# Patient Record
Sex: Male | Born: 1940 | Race: White | Hispanic: No | Smoking: Former smoker
Health system: Southern US, Community
[De-identification: ages and names within clinical notes are randomized; demographics above are authoritative.]

## PROBLEM LIST (undated history)

## (undated) DIAGNOSIS — L309 Dermatitis, unspecified: Secondary | ICD-10-CM

## (undated) DIAGNOSIS — S72002A Fracture of unspecified part of neck of left femur, initial encounter for closed fracture: Secondary | ICD-10-CM

## (undated) DIAGNOSIS — G4733 Obstructive sleep apnea (adult) (pediatric): Secondary | ICD-10-CM

## (undated) DIAGNOSIS — K5792 Diverticulitis of intestine, part unspecified, without perforation or abscess without bleeding: Secondary | ICD-10-CM

## (undated) DIAGNOSIS — E785 Hyperlipidemia, unspecified: Secondary | ICD-10-CM

## (undated) DIAGNOSIS — N529 Male erectile dysfunction, unspecified: Secondary | ICD-10-CM

## (undated) DIAGNOSIS — M810 Age-related osteoporosis without current pathological fracture: Secondary | ICD-10-CM

## (undated) DIAGNOSIS — I10 Essential (primary) hypertension: Secondary | ICD-10-CM

## (undated) HISTORY — DX: Diverticulitis of intestine, part unspecified, without perforation or abscess without bleeding: K57.92

## (undated) HISTORY — PX: TONSILLECTOMY: SUR1361

## (undated) HISTORY — DX: Obstructive sleep apnea (adult) (pediatric): G47.33

## (undated) HISTORY — DX: Fracture of unspecified part of neck of left femur, initial encounter for closed fracture: S72.002A

## (undated) HISTORY — DX: Essential (primary) hypertension: I10

## (undated) HISTORY — DX: Hyperlipidemia, unspecified: E78.5

## (undated) HISTORY — DX: Male erectile dysfunction, unspecified: N52.9

## (undated) HISTORY — DX: Dermatitis, unspecified: L30.9

---

## 2003-06-04 ENCOUNTER — Emergency Department (HOSPITAL_COMMUNITY): Admission: EM | Admit: 2003-06-04 | Discharge: 2003-06-05 | Payer: Self-pay | Admitting: Emergency Medicine

## 2003-11-06 HISTORY — PX: COLONOSCOPY: SHX174

## 2004-03-02 ENCOUNTER — Ambulatory Visit (HOSPITAL_COMMUNITY): Admission: RE | Admit: 2004-03-02 | Discharge: 2004-03-02 | Payer: Self-pay | Admitting: Gastroenterology

## 2008-06-08 ENCOUNTER — Encounter: Admission: RE | Admit: 2008-06-08 | Discharge: 2008-06-08 | Payer: Self-pay | Admitting: Sports Medicine

## 2008-06-08 ENCOUNTER — Ambulatory Visit: Payer: Self-pay | Admitting: Sports Medicine

## 2008-06-08 DIAGNOSIS — R269 Unspecified abnormalities of gait and mobility: Secondary | ICD-10-CM

## 2008-06-08 DIAGNOSIS — M25559 Pain in unspecified hip: Secondary | ICD-10-CM | POA: Insufficient documentation

## 2008-07-14 ENCOUNTER — Ambulatory Visit: Payer: Self-pay | Admitting: Sports Medicine

## 2009-02-17 ENCOUNTER — Emergency Department (HOSPITAL_COMMUNITY): Admission: EM | Admit: 2009-02-17 | Discharge: 2009-02-17 | Payer: Self-pay | Admitting: Emergency Medicine

## 2011-03-23 NOTE — Op Note (Signed)
NAMEZACHARIAS, [name] NO.:  [id number]   MEDICAL RECORD NO.:  [id number]                   PATIENT TYPE:  AMB   LOCATION:  ENDO                                 FACILITY:  [facility]   PHYSICIAN:  Danise Edge, M.D.                DATE OF BIRTH:  1940/12/25   DATE OF PROCEDURE:  03/02/2004  DATE OF DISCHARGE:                                 OPERATIVE REPORT   REFERRING PHYSICIAN:  Dr. Foye Deer.   PROCEDURE:  Colonoscopy.   PROCEDURE INDICATION:  Mr. [name] is a 70 year old male born [birth year].  Mr. [name] is undergoing a screening colonoscopy with polypectomy  to prevent colon cancer.   ENDOSCOPIST:  Danise Edge, M.D.   PREMEDICATION:  Versed 7.5 mg, Demerol 50 mg.   DESCRIPTION OF PROCEDURE:  After obtaining informed consent, Mr. [name] was  placed in the left lateral decubitus position.  I administered intravenous  Demerol and intravenous Versed to achieve conscious sedation for the  procedure.  The patient's blood pressure, oxygen saturation, and cardiac  rhythm were monitored throughout the procedure and documented in the medical  record.   Anal inspection and digital rectal exam were normal.  The Olympus adjustable  pediatric video colonoscope was introduced into the rectum and advanced to  the cecum.  Colonic preparation for the exam today was excellent.   Rectum:  Normal.   Sigmoid colon and descending colon:  Left colonic diverticulosis.   Splenic flexure:  Normal.   Transverse colon:  Normal.   Hepatic flexure:  Normal.   Ascending colon:  Normal.   Cecum and ileocecal valve:  Normal.   ASSESSMENT:  Normal screening proctocolonoscopy to the cecum.  Left colonic  diverticulosis without diverticulitis or diverticular stricture formation.                                               Danise Edge, M.D.    MJ/MEDQ  D:  03/02/2004  T:  03/02/2004  Job:  [id number]   cc:   Dellis Anes. Idell Pickles, M.D.  [address]  [place]  [place] [address]  Fax: [phone]

## 2011-12-11 DIAGNOSIS — G4733 Obstructive sleep apnea (adult) (pediatric): Secondary | ICD-10-CM | POA: Diagnosis not present

## 2011-12-11 DIAGNOSIS — I10 Essential (primary) hypertension: Secondary | ICD-10-CM | POA: Diagnosis not present

## 2012-04-11 DIAGNOSIS — L259 Unspecified contact dermatitis, unspecified cause: Secondary | ICD-10-CM | POA: Diagnosis not present

## 2012-06-04 DIAGNOSIS — L570 Actinic keratosis: Secondary | ICD-10-CM | POA: Diagnosis not present

## 2012-06-04 DIAGNOSIS — L821 Other seborrheic keratosis: Secondary | ICD-10-CM | POA: Diagnosis not present

## 2012-06-04 DIAGNOSIS — C44519 Basal cell carcinoma of skin of other part of trunk: Secondary | ICD-10-CM | POA: Diagnosis not present

## 2012-06-04 DIAGNOSIS — D485 Neoplasm of uncertain behavior of skin: Secondary | ICD-10-CM | POA: Diagnosis not present

## 2012-06-12 DIAGNOSIS — Z0000 Encounter for general adult medical examination without abnormal findings: Secondary | ICD-10-CM | POA: Diagnosis not present

## 2012-06-12 DIAGNOSIS — E785 Hyperlipidemia, unspecified: Secondary | ICD-10-CM | POA: Diagnosis not present

## 2012-06-12 DIAGNOSIS — Z23 Encounter for immunization: Secondary | ICD-10-CM | POA: Diagnosis not present

## 2012-06-12 DIAGNOSIS — I10 Essential (primary) hypertension: Secondary | ICD-10-CM | POA: Diagnosis not present

## 2012-06-12 DIAGNOSIS — Z125 Encounter for screening for malignant neoplasm of prostate: Secondary | ICD-10-CM | POA: Diagnosis not present

## 2012-06-12 DIAGNOSIS — L2089 Other atopic dermatitis: Secondary | ICD-10-CM | POA: Diagnosis not present

## 2012-06-12 DIAGNOSIS — N529 Male erectile dysfunction, unspecified: Secondary | ICD-10-CM | POA: Diagnosis not present

## 2012-06-12 DIAGNOSIS — G4733 Obstructive sleep apnea (adult) (pediatric): Secondary | ICD-10-CM | POA: Diagnosis not present

## 2012-07-03 DIAGNOSIS — G4733 Obstructive sleep apnea (adult) (pediatric): Secondary | ICD-10-CM | POA: Diagnosis not present

## 2012-07-03 DIAGNOSIS — I10 Essential (primary) hypertension: Secondary | ICD-10-CM | POA: Diagnosis not present

## 2012-07-03 DIAGNOSIS — G479 Sleep disorder, unspecified: Secondary | ICD-10-CM | POA: Diagnosis not present

## 2013-01-28 DIAGNOSIS — G4733 Obstructive sleep apnea (adult) (pediatric): Secondary | ICD-10-CM | POA: Diagnosis not present

## 2013-01-28 DIAGNOSIS — I10 Essential (primary) hypertension: Secondary | ICD-10-CM | POA: Diagnosis not present

## 2013-02-04 DIAGNOSIS — H524 Presbyopia: Secondary | ICD-10-CM | POA: Diagnosis not present

## 2013-02-04 DIAGNOSIS — H2510 Age-related nuclear cataract, unspecified eye: Secondary | ICD-10-CM | POA: Diagnosis not present

## 2013-03-11 DIAGNOSIS — D239 Other benign neoplasm of skin, unspecified: Secondary | ICD-10-CM | POA: Diagnosis not present

## 2013-03-11 DIAGNOSIS — L219 Seborrheic dermatitis, unspecified: Secondary | ICD-10-CM | POA: Diagnosis not present

## 2013-03-11 DIAGNOSIS — Z85828 Personal history of other malignant neoplasm of skin: Secondary | ICD-10-CM | POA: Diagnosis not present

## 2013-03-11 DIAGNOSIS — L570 Actinic keratosis: Secondary | ICD-10-CM | POA: Diagnosis not present

## 2013-03-11 DIAGNOSIS — L408 Other psoriasis: Secondary | ICD-10-CM | POA: Diagnosis not present

## 2013-03-11 DIAGNOSIS — L719 Rosacea, unspecified: Secondary | ICD-10-CM | POA: Diagnosis not present

## 2013-04-11 DIAGNOSIS — S61409A Unspecified open wound of unspecified hand, initial encounter: Secondary | ICD-10-CM | POA: Diagnosis not present

## 2013-06-30 DIAGNOSIS — Z125 Encounter for screening for malignant neoplasm of prostate: Secondary | ICD-10-CM | POA: Diagnosis not present

## 2013-06-30 DIAGNOSIS — E785 Hyperlipidemia, unspecified: Secondary | ICD-10-CM | POA: Diagnosis not present

## 2013-06-30 DIAGNOSIS — I10 Essential (primary) hypertension: Secondary | ICD-10-CM | POA: Diagnosis not present

## 2013-07-01 DIAGNOSIS — Z1331 Encounter for screening for depression: Secondary | ICD-10-CM | POA: Diagnosis not present

## 2013-07-01 DIAGNOSIS — E785 Hyperlipidemia, unspecified: Secondary | ICD-10-CM | POA: Diagnosis not present

## 2013-07-01 DIAGNOSIS — N529 Male erectile dysfunction, unspecified: Secondary | ICD-10-CM | POA: Diagnosis not present

## 2013-07-01 DIAGNOSIS — Z0000 Encounter for general adult medical examination without abnormal findings: Secondary | ICD-10-CM | POA: Diagnosis not present

## 2013-07-01 DIAGNOSIS — I10 Essential (primary) hypertension: Secondary | ICD-10-CM | POA: Diagnosis not present

## 2013-07-01 DIAGNOSIS — G4733 Obstructive sleep apnea (adult) (pediatric): Secondary | ICD-10-CM | POA: Diagnosis not present

## 2013-07-01 DIAGNOSIS — Z23 Encounter for immunization: Secondary | ICD-10-CM | POA: Diagnosis not present

## 2013-07-08 ENCOUNTER — Other Ambulatory Visit: Payer: Self-pay | Admitting: Family Medicine

## 2013-07-08 DIAGNOSIS — Z136 Encounter for screening for cardiovascular disorders: Secondary | ICD-10-CM

## 2013-07-15 ENCOUNTER — Ambulatory Visit
Admission: RE | Admit: 2013-07-15 | Discharge: 2013-07-15 | Disposition: A | Discharge status: Home / Self Care | Payer: Medicare Other | Source: Ambulatory Visit | Attending: Family Medicine | Admitting: Family Medicine

## 2013-07-15 DIAGNOSIS — Z136 Encounter for screening for cardiovascular disorders: Secondary | ICD-10-CM

## 2013-07-21 DIAGNOSIS — G4733 Obstructive sleep apnea (adult) (pediatric): Secondary | ICD-10-CM | POA: Diagnosis not present

## 2013-07-21 DIAGNOSIS — I10 Essential (primary) hypertension: Secondary | ICD-10-CM | POA: Diagnosis not present

## 2013-08-03 DIAGNOSIS — S32009A Unspecified fracture of unspecified lumbar vertebra, initial encounter for closed fracture: Secondary | ICD-10-CM | POA: Diagnosis not present

## 2013-08-03 DIAGNOSIS — Z13820 Encounter for screening for osteoporosis: Secondary | ICD-10-CM | POA: Diagnosis not present

## 2013-08-20 DIAGNOSIS — M810 Age-related osteoporosis without current pathological fracture: Secondary | ICD-10-CM | POA: Diagnosis not present

## 2013-12-01 ENCOUNTER — Telehealth: Payer: Self-pay | Admitting: Cardiology

## 2013-12-01 NOTE — Telephone Encounter (Signed)
Have not received download.  

## 2013-12-01 NOTE — Telephone Encounter (Signed)
Called stating he took his Cpap machine to [facility] for them to download information.  He is calling wanting to know if Dr. Radford Pax received the information and " what they did "was what Dr. Radford Pax wanted.  Spoke w/Betsy  Sweetser  w/[facility] and she will fax information to Dr. Radford Pax.  Pt states he is not having any problems.  Has an appointment in March w/Dr. Radford Pax. Will forward to Dr. Radford Pax for her to evaluate the information.

## 2013-12-01 NOTE — Telephone Encounter (Signed)
New message  Pt called states that he is seen for Cpac // he says that there should have been updates with his settings and he is not sure that this has ever been done// please call to discuss.

## 2013-12-02 NOTE — Telephone Encounter (Signed)
Waiting on Download.

## 2013-12-09 ENCOUNTER — Encounter: Payer: Self-pay | Admitting: Cardiology

## 2013-12-09 NOTE — Telephone Encounter (Signed)
Gwinda Passe is looking into this because she stated she did not have any notes on file for this pt coming in when he said he did.

## 2013-12-09 NOTE — Telephone Encounter (Signed)
Messaged Betsy at [facility] to see why we have no yet received fax.

## 2013-12-14 NOTE — Telephone Encounter (Signed)
[facility] is going to [facility] to [facility] to have card Downloaded. Will let pt know once we received.

## 2014-01-13 ENCOUNTER — Ambulatory Visit: Payer: Medicare Other | Admitting: Cardiology

## 2014-01-14 ENCOUNTER — Encounter: Payer: Self-pay | Admitting: General Surgery

## 2014-01-14 DIAGNOSIS — I10 Essential (primary) hypertension: Secondary | ICD-10-CM

## 2014-01-14 DIAGNOSIS — G4733 Obstructive sleep apnea (adult) (pediatric): Secondary | ICD-10-CM | POA: Insufficient documentation

## 2014-01-19 ENCOUNTER — Ambulatory Visit (INDEPENDENT_AMBULATORY_CARE_PROVIDER_SITE_OTHER): Payer: Medicare Other | Admitting: Cardiology

## 2014-01-19 ENCOUNTER — Encounter: Payer: Self-pay | Admitting: Cardiology

## 2014-01-19 VITALS — BP 132/77 | HR 79 | Ht 71.0 in | Wt 175.0 lb

## 2014-01-19 DIAGNOSIS — G4733 Obstructive sleep apnea (adult) (pediatric): Secondary | ICD-10-CM | POA: Diagnosis not present

## 2014-01-19 DIAGNOSIS — I10 Essential (primary) hypertension: Secondary | ICD-10-CM

## 2014-01-19 NOTE — Progress Notes (Signed)
[place], [place], [place]  [address] Phone: [phone] Fax:  [phone]  Date:  01/19/2014   ID:  Antonio [name], DOB 10-23-1941, MRN [id number]  PCP:  Antonio Pac, MD  Sleep Medicine:  Antonio Him, MD   History of Present Illness: Antonio [name] is a 73 y.o. male with a history of OSA and HTN who presents today for followup.  He is doing well.  He tolerates his CPAP device without problems.  He feels better rested when he gets up in the am and has no daytime sleepiness than he had in the past.  He uses a nasal mask with no chin strap.  He feels the pressure is adequate.     Wt Readings from Last 3 Encounters:  01/19/14 175 lb (79.379 kg)  07/14/08 184 lb (83.462 kg)  06/08/08 188 lb (85.276 kg)     Past Medical History  Diagnosis Date  . Dyslipidemia   . OSA (obstructive sleep apnea)     primarily upper airway resistance syndrome, on CPAP at 11 cm H2O (PSG AHI 3.37/hr and RDI 46.5/hr) followed by Dr Antonio [name]  . Hypertension   . Eczema     BCC and AK followed by dermatologist Dr Antonio [name] Annually  . ED (erectile dysfunction)   . Diverticulitis     on colonoscopy in 2005 w Dr Antonio [name]  . Hip fracture, left     Current Outpatient Prescriptions  Medication Sig Dispense Refill  . aspirin 81 MG tablet Take 81 mg by mouth daily.      . enalapril (VASOTEC) 10 MG tablet Take 10 mg by mouth daily.      . Multiple Vitamin (MULTIVITAMIN) tablet Take 1 tablet by mouth daily.      . sildenafil (VIAGRA) 100 MG tablet Take 100 mg by mouth daily as needed for erectile dysfunction.      . simvastatin (ZOCOR) 40 MG tablet Take 40 mg by mouth daily.      Marland Kitchen UNABLE TO FIND CPAP       No current facility-administered medications for this visit.    Allergies:   No Known Allergies  Social History:  The patient  reports that he quit smoking about 19 years ago. He does not have any smokeless tobacco history on file. He reports that he drinks alcohol. He reports that he does  not use illicit drugs.   Family History:  The patient's family history includes Breast cancer in his mother; Dementia in his father; Ovarian cancer in his sister; Rectal cancer in his sister.   ROS:  Please see the history of present illness.      All other systems reviewed and negative.   PHYSICAL EXAM: VS:  BP 132/77  Pulse 79  Ht 5\' 11"  (1.803 m)  Wt 175 lb (79.379 kg)  BMI 24.42 kg/m2 Well nourished, well developed, in no acute distress HEENT: normal Neck: no JVD Cardiac:  normal S1, S2; RRR; no murmur Lungs:  clear to auscultation bilaterally, no wheezing, rhonchi or rales Abd: soft, nontender, no hepatomegaly Ext: no edema Skin: warm and dry Neuro:  CNs 2-12 intact, no focal abnormalities noted       ASSESSMENT AND PLAN:  1. OSA on CPAP and tolerating well.  His 2 week autotitration showed an AHI of 7.3/hr with 95% pressure 8.1cm.  I am going to put [name] back on 11cm H2O (he was on this before) and get a d/l in 2 weeks 2. HTN well  controlled  - continue Enalapril  Followup with me in 6 months  Signed, Antonio Him, MD 01/19/2014 1:50 PM

## 2014-01-19 NOTE — Patient Instructions (Signed)
Your physician recommends that you continue on your current medications as directed. Please refer to the Current Medication list given to you today.  Your physician wants you to follow-up in: 6 Months with Dr Turner You will receive a reminder letter in the mail two months in advance. If you don't receive a letter, please call our office to schedule the follow-up appointment.  

## 2014-02-01 ENCOUNTER — Encounter: Payer: Self-pay | Admitting: Cardiology

## 2014-02-01 DIAGNOSIS — M25559 Pain in unspecified hip: Secondary | ICD-10-CM | POA: Diagnosis not present

## 2014-02-11 DIAGNOSIS — H524 Presbyopia: Secondary | ICD-10-CM | POA: Diagnosis not present

## 2014-02-11 DIAGNOSIS — H52229 Regular astigmatism, unspecified eye: Secondary | ICD-10-CM | POA: Diagnosis not present

## 2014-02-11 DIAGNOSIS — H5200 Hypermetropia, unspecified eye: Secondary | ICD-10-CM | POA: Diagnosis not present

## 2014-02-11 DIAGNOSIS — H25019 Cortical age-related cataract, unspecified eye: Secondary | ICD-10-CM | POA: Diagnosis not present

## 2014-03-09 ENCOUNTER — Telehealth: Payer: Self-pay | Admitting: Cardiology

## 2014-03-09 NOTE — Telephone Encounter (Signed)
New message  Pt states that he was notified per [facility] CHART that he qualifies for a new CPAP machine. Request a call back to discuss further.

## 2014-03-09 NOTE — Telephone Encounter (Signed)
To Learned.

## 2014-03-10 NOTE — Telephone Encounter (Signed)
Yes but need to check with DME to see if he needs another sleep study

## 2014-03-10 NOTE — Telephone Encounter (Signed)
Called pt and he stated his DME told him he was eligible to get a new CPAP machine. He stated he would be ok with a new machine because he states he is not sleeping well. OK to call a new rx in.?

## 2014-03-11 ENCOUNTER — Other Ambulatory Visit: Payer: Self-pay | Admitting: General Surgery

## 2014-03-11 DIAGNOSIS — G4733 Obstructive sleep apnea (adult) (pediatric): Secondary | ICD-10-CM

## 2014-03-11 NOTE — Telephone Encounter (Signed)
Will ask Betsy. Ordered in [employer]

## 2014-03-15 DIAGNOSIS — Z85828 Personal history of other malignant neoplasm of skin: Secondary | ICD-10-CM | POA: Diagnosis not present

## 2014-03-15 DIAGNOSIS — D485 Neoplasm of uncertain behavior of skin: Secondary | ICD-10-CM | POA: Diagnosis not present

## 2014-03-15 DIAGNOSIS — D2330 Other benign neoplasm of skin of unspecified part of face: Secondary | ICD-10-CM | POA: Diagnosis not present

## 2014-03-15 DIAGNOSIS — L259 Unspecified contact dermatitis, unspecified cause: Secondary | ICD-10-CM | POA: Diagnosis not present

## 2014-03-15 DIAGNOSIS — L570 Actinic keratosis: Secondary | ICD-10-CM | POA: Diagnosis not present

## 2014-03-15 DIAGNOSIS — D239 Other benign neoplasm of skin, unspecified: Secondary | ICD-10-CM | POA: Diagnosis not present

## 2014-03-15 DIAGNOSIS — D1801 Hemangioma of skin and subcutaneous tissue: Secondary | ICD-10-CM | POA: Diagnosis not present

## 2014-03-15 DIAGNOSIS — L408 Other psoriasis: Secondary | ICD-10-CM | POA: Diagnosis not present

## 2014-03-16 ENCOUNTER — Other Ambulatory Visit: Payer: Self-pay | Admitting: General Surgery

## 2014-03-16 ENCOUNTER — Encounter: Payer: Self-pay | Admitting: Cardiology

## 2014-03-16 DIAGNOSIS — G4733 Obstructive sleep apnea (adult) (pediatric): Secondary | ICD-10-CM

## 2014-04-02 ENCOUNTER — Telehealth: Payer: Self-pay | Admitting: Cardiology

## 2014-04-02 DIAGNOSIS — G4733 Obstructive sleep apnea (adult) (pediatric): Secondary | ICD-10-CM

## 2014-04-02 NOTE — Telephone Encounter (Signed)
cpap order placed

## 2014-05-26 DIAGNOSIS — C44711 Basal cell carcinoma of skin of unspecified lower limb, including hip: Secondary | ICD-10-CM | POA: Diagnosis not present

## 2014-05-26 DIAGNOSIS — D485 Neoplasm of uncertain behavior of skin: Secondary | ICD-10-CM | POA: Diagnosis not present

## 2014-05-26 DIAGNOSIS — Z85828 Personal history of other malignant neoplasm of skin: Secondary | ICD-10-CM | POA: Diagnosis not present

## 2014-05-26 DIAGNOSIS — C44319 Basal cell carcinoma of skin of other parts of face: Secondary | ICD-10-CM | POA: Diagnosis not present

## 2014-06-09 DIAGNOSIS — Z85828 Personal history of other malignant neoplasm of skin: Secondary | ICD-10-CM | POA: Diagnosis not present

## 2014-06-09 DIAGNOSIS — C44319 Basal cell carcinoma of skin of other parts of face: Secondary | ICD-10-CM | POA: Diagnosis not present

## 2014-07-07 DIAGNOSIS — M810 Age-related osteoporosis without current pathological fracture: Secondary | ICD-10-CM | POA: Diagnosis not present

## 2014-07-07 DIAGNOSIS — Z125 Encounter for screening for malignant neoplasm of prostate: Secondary | ICD-10-CM | POA: Diagnosis not present

## 2014-07-07 DIAGNOSIS — I10 Essential (primary) hypertension: Secondary | ICD-10-CM | POA: Diagnosis not present

## 2014-07-08 DIAGNOSIS — Z23 Encounter for immunization: Secondary | ICD-10-CM | POA: Diagnosis not present

## 2014-07-08 DIAGNOSIS — Z0000 Encounter for general adult medical examination without abnormal findings: Secondary | ICD-10-CM | POA: Diagnosis not present

## 2014-07-08 DIAGNOSIS — E785 Hyperlipidemia, unspecified: Secondary | ICD-10-CM | POA: Diagnosis not present

## 2014-07-08 DIAGNOSIS — I10 Essential (primary) hypertension: Secondary | ICD-10-CM | POA: Diagnosis not present

## 2014-07-08 DIAGNOSIS — H6120 Impacted cerumen, unspecified ear: Secondary | ICD-10-CM | POA: Diagnosis not present

## 2014-07-08 DIAGNOSIS — M810 Age-related osteoporosis without current pathological fracture: Secondary | ICD-10-CM | POA: Diagnosis not present

## 2014-07-08 DIAGNOSIS — C4491 Basal cell carcinoma of skin, unspecified: Secondary | ICD-10-CM | POA: Diagnosis not present

## 2014-07-08 DIAGNOSIS — G4733 Obstructive sleep apnea (adult) (pediatric): Secondary | ICD-10-CM | POA: Diagnosis not present

## 2014-07-19 ENCOUNTER — Encounter: Payer: Self-pay | Admitting: Cardiology

## 2014-07-20 ENCOUNTER — Ambulatory Visit (INDEPENDENT_AMBULATORY_CARE_PROVIDER_SITE_OTHER): Payer: Medicare Other | Admitting: Cardiology

## 2014-07-20 VITALS — BP 128/72 | HR 74 | Ht 71.0 in | Wt 170.0 lb

## 2014-07-20 DIAGNOSIS — I10 Essential (primary) hypertension: Secondary | ICD-10-CM

## 2014-07-20 DIAGNOSIS — G4733 Obstructive sleep apnea (adult) (pediatric): Secondary | ICD-10-CM

## 2014-07-20 NOTE — Patient Instructions (Signed)
Your physician recommends that you continue on your current medications as directed. Please refer to the Current Medication list given to you today.   Continue current CPAP/BiPAP Settings.  Your physician wants you to follow-up in: 6 months with Dr Turner You will receive a reminder letter in the mail two months in advance. If you don't receive a letter, please call our office to schedule the follow-up appointment.  

## 2014-07-20 NOTE — Progress Notes (Signed)
[place], [place], [place]  [address] Phone: [phone] Fax:  [phone]  Date:  07/20/2014   ID:  Antonio [name], DOB May 07, 1941, MRN [id number]  PCP:  Gennette Pac, MD  Cardiologist:  Fransico Him, MD     History of Present Illness: Antonio [name] is a 73 y.o. male with a history of OSA and HTN who presents today for followup. He is doing well. He tolerates his CPAP device without problems. He feels better rested when he gets up in the am and has no daytime sleepiness than he had in the past. He uses a nasal mask with no chin strap. He feels the pressure is adequate.    Wt Readings from Last 3 Encounters:  07/20/14 170 lb (77.111 kg)  01/19/14 175 lb (79.379 kg)  07/14/08 184 lb (83.462 kg)     Past Medical History  Diagnosis Date  . Dyslipidemia   . OSA (obstructive sleep apnea)     primarily upper airway resistance syndrome, on CPAP at 11 cm H2O (PSG AHI 3.37/hr and RDI 46.5/hr) followed by Dr Radford Pax  . Hypertension   . Eczema     BCC and AK followed by dermatologist Dr Ronnald Ramp Annually  . ED (erectile dysfunction)   . Diverticulitis     on colonoscopy in 2005 w Dr Wynetta Emery  . Hip fracture, left     Current Outpatient Prescriptions  Medication Sig Dispense Refill  . aspirin 81 MG tablet Take 81 mg by mouth daily.      . enalapril (VASOTEC) 10 MG tablet Take 10 mg by mouth daily.      . meloxicam (MOBIC) 15 MG tablet Take 15 mg by mouth daily.      . Multiple Vitamin (MULTIVITAMIN) tablet Take 1 tablet by mouth daily.      . sildenafil (VIAGRA) 100 MG tablet Take 100 mg by mouth daily as needed for erectile dysfunction.      . simvastatin (ZOCOR) 40 MG tablet Take 40 mg by mouth daily.      Marland Kitchen UNABLE TO FIND CPAP       No current facility-administered medications for this visit.    Allergies:   No Known Allergies  Social History:  The patient  reports that he quit smoking about 19 years ago. He does not have any smokeless tobacco history on file.  He reports that he drinks alcohol. He reports that he does not use illicit drugs.   Family History:  The patient's family history includes Breast cancer in his mother; Dementia in his father; Ovarian cancer in his sister; Rectal cancer in his sister.   ROS:  Please see the history of present illness.      All other systems reviewed and negative.   PHYSICAL EXAM: VS:  BP 128/72  Pulse 74  Ht 5\' 11"  (1.803 m)  Wt 170 lb (77.111 kg)  BMI 23.72 kg/m2 Well nourished, well developed, in no acute distress HEENT: normal Neck: no JVD Cardiac:  normal S1, S2; RRR; no murmur Lungs:  clear to auscultation bilaterally, no wheezing, rhonchi or rales Abd: soft, nontender, no hepatomegaly Ext: no edema Skin: warm and dry Neuro:  CNs 2-12 intact, no focal abnormalities noted  ASSESSMENT AND PLAN:  1. OSA on CPAP and tolerating well. Download today showed an AHI of 1.6/hr on 11cm H2O and 100% compliance in using more than 4 hours nightly.  He will continue on current settings. 2. HTN well controlled - continue  Enalapril   Followup with me in 6 months      Signed, Fransico Him, MD 07/20/2014 11:32 AM

## 2014-11-09 ENCOUNTER — Encounter: Payer: Self-pay | Admitting: Cardiology

## 2014-11-09 ENCOUNTER — Telehealth: Payer: Self-pay | Admitting: Cardiology

## 2015-01-18 NOTE — Progress Notes (Signed)
Cardiology Office Note   Date:  01/19/2015   ID:  Antonio [name], DOB 07-31-1941, MRN [id number]  PCP:  Gennette Pac, MD  Cardiologist:   Sueanne Margarita, MD   Chief Complaint  Patient presents with  . Sleep Apnea  . Hypertension      History of Present Illness: Antonio [name] is a 74 y.o. male with a history of OSA and HTN who presents today for followup. He is doing well. He tolerates his CPAP device without problems. He feels  rested when he gets up in the am and has no daytime sleepiness than he had in the past. He uses a nasal mask with no chin strap. He feels the pressure is adequate.   He has no nasal or mouth dryness or congestion.     Past Medical History  Diagnosis Date  . Dyslipidemia   . OSA (obstructive sleep apnea)     primarily upper airway resistance syndrome, on CPAP at 11 cm H2O (PSG AHI 3.37/hr and RDI 46.5/hr) followed by Dr Radford Pax  . Hypertension   . Eczema     BCC and AK followed by dermatologist Dr Ronnald Ramp Annually  . ED (erectile dysfunction)   . Diverticulitis     on colonoscopy in 2005 w Dr Wynetta Emery  . Hip fracture, left     Past Surgical History  Procedure Laterality Date  . Colonoscopy  2005     Current Outpatient Prescriptions  Medication Sig Dispense Refill  . aspirin 81 MG tablet Take 81 mg by mouth daily.    . enalapril (VASOTEC) 10 MG tablet Take 10 mg by mouth daily.    . meloxicam (MOBIC) 15 MG tablet Take 15 mg by mouth daily.    . Multiple Vitamin (MULTIVITAMIN) tablet Take 1 tablet by mouth daily.    . sildenafil (VIAGRA) 100 MG tablet Take 100 mg by mouth daily as needed for erectile dysfunction.    . simvastatin (ZOCOR) 40 MG tablet Take 40 mg by mouth daily.    Marland Kitchen UNABLE TO FIND CPAP     No current facility-administered medications for this visit.    Allergies:   Review of patient's allergies indicates no known allergies.    Social History:  The patient  reports that he quit smoking about 20 years ago. He does not have  any smokeless tobacco history on file. He reports that he drinks alcohol. He reports that he does not use illicit drugs.   Family History:  The patient's family history includes Breast cancer in his mother; Dementia in his father; Ovarian cancer in his sister; Rectal cancer in his sister.    ROS:  Please see the history of present illness.   Otherwise, review of systems are positive for none.   All other systems are reviewed and negative.    PHYSICAL EXAM: VS:  BP 130/70 mmHg  Pulse 81  Ht 5\' 11"  (1.803 m)  Wt 166 lb 12.8 oz (75.66 kg)  BMI 23.27 kg/m2  SpO2 96% , BMI Body mass index is 23.27 kg/(m^2). GEN: Well nourished, well developed, in no acute distress HEENT: normal Neck: no JVD, carotid bruits, or masses Cardiac: RRR; no murmurs, rubs, or gallops,no edema  Respiratory:  clear to auscultation bilaterally, normal work of breathing GI: soft, nontender, nondistended, + BS MS: no deformity or atrophy Skin: warm and dry, no rash Neuro:  Strength and sensation are intact Psych: euthymic mood, full affect   EKG:  EKG is not ordered today.  Recent Labs: No results found for requested labs within last 365 days.    Lipid Panel No results found for: CHOL, TRIG, HDL, CHOLHDL, VLDL, LDLCALC, LDLDIRECT    Wt Readings from Last 3 Encounters:  01/19/15 166 lb 12.8 oz (75.66 kg)  07/20/14 170 lb (77.111 kg)  01/19/14 175 lb (79.379 kg)       ASSESSMENT AND PLAN:  1. OSA on CPAP and tolerating well. Download today showed an AHI of 1.6/hr on 11cm H2O and 97% compliance in using more than 4 hours nightly. He will continue on current settings at 11cm H2O 2. HTN well controlled - continue Enalapril   Current medicines are reviewed at length with the patient today.  The patient does not have concerns regarding medicines.  The following changes have been made:  no change  Labs/ tests ordered today include: None  No orders of the defined types were placed in this  encounter.     Disposition:   FU with me in 6 months   Signed, Sueanne Margarita, MD  01/19/2015 11:33 AM    [facility] [place], [place], [place]  [address] Phone: [phone]; Fax: [phone]

## 2015-01-19 ENCOUNTER — Encounter: Payer: Self-pay | Admitting: Cardiology

## 2015-01-19 ENCOUNTER — Ambulatory Visit (INDEPENDENT_AMBULATORY_CARE_PROVIDER_SITE_OTHER): Payer: Medicare Other | Admitting: Cardiology

## 2015-01-19 VITALS — BP 130/70 | HR 81 | Ht 71.0 in | Wt 166.8 lb

## 2015-01-19 DIAGNOSIS — I10 Essential (primary) hypertension: Secondary | ICD-10-CM

## 2015-01-19 DIAGNOSIS — G4733 Obstructive sleep apnea (adult) (pediatric): Secondary | ICD-10-CM

## 2015-01-19 NOTE — Patient Instructions (Signed)
Your physician recommends that you continue on your current medications as directed. Please refer to the Current Medication list given to you today.  Your physician wants you to follow-up in: 6 months with Dr Turner You will receive a reminder letter in the mail two months in advance. If you don't receive a letter, please call our office to schedule the follow-up appointment.  

## 2015-01-27 ENCOUNTER — Encounter: Payer: Self-pay | Admitting: Cardiology

## 2015-01-28 NOTE — Telephone Encounter (Signed)
error 

## 2015-03-09 DIAGNOSIS — D485 Neoplasm of uncertain behavior of skin: Secondary | ICD-10-CM | POA: Diagnosis not present

## 2015-03-09 DIAGNOSIS — D1801 Hemangioma of skin and subcutaneous tissue: Secondary | ICD-10-CM | POA: Diagnosis not present

## 2015-03-09 DIAGNOSIS — C4441 Basal cell carcinoma of skin of scalp and neck: Secondary | ICD-10-CM | POA: Diagnosis not present

## 2015-03-09 DIAGNOSIS — C44519 Basal cell carcinoma of skin of other part of trunk: Secondary | ICD-10-CM | POA: Diagnosis not present

## 2015-03-09 DIAGNOSIS — L820 Inflamed seborrheic keratosis: Secondary | ICD-10-CM | POA: Diagnosis not present

## 2015-03-09 DIAGNOSIS — L821 Other seborrheic keratosis: Secondary | ICD-10-CM | POA: Diagnosis not present

## 2015-03-09 DIAGNOSIS — D225 Melanocytic nevi of trunk: Secondary | ICD-10-CM | POA: Diagnosis not present

## 2015-03-09 DIAGNOSIS — Z85828 Personal history of other malignant neoplasm of skin: Secondary | ICD-10-CM | POA: Diagnosis not present

## 2015-03-09 DIAGNOSIS — L400 Psoriasis vulgaris: Secondary | ICD-10-CM | POA: Diagnosis not present

## 2015-03-09 DIAGNOSIS — L570 Actinic keratosis: Secondary | ICD-10-CM | POA: Diagnosis not present

## 2015-04-28 ENCOUNTER — Encounter: Payer: Self-pay | Admitting: Cardiology

## 2015-05-31 DIAGNOSIS — H52223 Regular astigmatism, bilateral: Secondary | ICD-10-CM | POA: Diagnosis not present

## 2015-05-31 DIAGNOSIS — H5203 Hypermetropia, bilateral: Secondary | ICD-10-CM | POA: Diagnosis not present

## 2015-05-31 DIAGNOSIS — H524 Presbyopia: Secondary | ICD-10-CM | POA: Diagnosis not present

## 2015-05-31 DIAGNOSIS — H2513 Age-related nuclear cataract, bilateral: Secondary | ICD-10-CM | POA: Diagnosis not present

## 2015-07-27 ENCOUNTER — Encounter: Payer: Self-pay | Admitting: Cardiology

## 2015-07-27 ENCOUNTER — Ambulatory Visit (INDEPENDENT_AMBULATORY_CARE_PROVIDER_SITE_OTHER): Payer: Medicare Other | Admitting: Cardiology

## 2015-07-27 ENCOUNTER — Telehealth: Payer: Self-pay | Admitting: Cardiology

## 2015-07-27 VITALS — BP 120/62 | HR 84 | Ht 71.0 in | Wt 163.8 lb

## 2015-07-27 DIAGNOSIS — I10 Essential (primary) hypertension: Secondary | ICD-10-CM

## 2015-07-27 DIAGNOSIS — G4733 Obstructive sleep apnea (adult) (pediatric): Secondary | ICD-10-CM | POA: Diagnosis not present

## 2015-07-27 NOTE — Patient Instructions (Signed)

## 2015-07-27 NOTE — Telephone Encounter (Signed)
New message    Pt wants to report that there is no changes for his medicaions

## 2015-07-27 NOTE — Progress Notes (Signed)
Cardiology Office Note   Date:  07/27/2015   ID:  Antonio [name], DOB 04/01/1941, MRN [id number]  PCP:  Gennette Pac, MD    Chief Complaint  Patient presents with  . OSA      History of Present Illness: Antonio [name] is a 74 y.o. male with a history of OSA and HTN who presents today for followup. He is doing well. He tolerates his CPAP device without problems. He feels rested when he gets up in the am and has no daytime sleepiness than he had in the past. He uses a nasal mask with no chin strap. He feels the pressure is adequate. He has no nasal or mouth dryness or congestion.     Past Medical History  Diagnosis Date  . Dyslipidemia   . OSA (obstructive sleep apnea)     primarily upper airway resistance syndrome, on CPAP at 11 cm H2O (PSG AHI 3.37/hr and RDI 46.5/hr) followed by Dr Radford Pax  . Hypertension   . Eczema     BCC and AK followed by dermatologist Dr Ronnald Ramp Annually  . ED (erectile dysfunction)   . Diverticulitis     on colonoscopy in 2005 w Dr Wynetta Emery  . Hip fracture, left     Past Surgical History  Procedure Laterality Date  . Colonoscopy  2005     Current Outpatient Prescriptions  Medication Sig Dispense Refill  . aspirin 81 MG tablet Take 81 mg by mouth daily.    . enalapril (VASOTEC) 10 MG tablet Take 10 mg by mouth daily.    . meloxicam (MOBIC) 15 MG tablet Take 15 mg by mouth daily.    . Multiple Vitamin (MULTIVITAMIN) tablet Take 1 tablet by mouth daily.    . sildenafil (VIAGRA) 100 MG tablet Take 100 mg by mouth daily as needed for erectile dysfunction.    . simvastatin (ZOCOR) 40 MG tablet Take 40 mg by mouth daily.    Marland Kitchen UNABLE TO FIND CPAP     No current facility-administered medications for this visit.    Allergies:   Review of patient's allergies indicates no known allergies.    Social History:  The patient  reports that he quit smoking about 20 years ago. He does not have any smokeless tobacco history on file. He  reports that he drinks alcohol. He reports that he does not use illicit drugs.   Family History:  The patient's family history includes Breast cancer in his mother; Dementia in his father; Ovarian cancer in his sister; Rectal cancer in his sister.    ROS:  Please see the history of present illness.   Otherwise, review of systems are positive for none.   All other systems are reviewed and negative.    PHYSICAL EXAM: VS:  BP 120/62 mmHg  Pulse 84  Ht 5\' 11"  (1.803 m)  Wt 163 lb 12.8 oz (74.299 kg)  BMI 22.86 kg/m2  SpO2 96% , BMI Body mass index is 22.86 kg/(m^2). GEN: Well nourished, well developed, in no acute distress HEENT: normal Neck: no JVD, carotid bruits, or masses Cardiac: RRR; no murmurs, rubs, or gallops,no edema  Respiratory:  clear to auscultation bilaterally, normal work of breathing GI: soft, nontender, nondistended, + BS MS: no deformity or atrophy Skin: warm and dry, no rash Neuro:  Strength and sensation are intact Psych: euthymic mood, full affect   EKG:  EKG is not ordered  today.    Recent Labs: No results found for requested labs within last 365 days.    Lipid Panel No results found for: CHOL, TRIG, HDL, CHOLHDL, VLDL, LDLCALC, LDLDIRECT    Wt Readings from Last 3 Encounters:  07/27/15 163 lb 12.8 oz (74.299 kg)  01/19/15 166 lb 12.8 oz (75.66 kg)  07/20/14 170 lb (77.111 kg)    ASSESSMENT AND PLAN:  1. OSA on CPAP and tolerating well. Download today showed an AHI of 1.2/hr on 11cm H2O and 100% compliance in using more than 4 hours nightly. He will continue on current settings at 11cm H2O 2. HTN well controlled - continue Enalapril    Current medicines are reviewed at length with the patient today.  The patient does not have concerns regarding medicines.  The following changes have been made:  no change  Labs/ tests ordered today: See above Assessment and Plan No orders of the defined types were placed in this encounter.     Disposition:    FU with me in 1 year  Signed, Sueanne Margarita, MD  07/27/2015 9:05 AM    [facility] [place], [place], [place]  [address] Phone: [phone]; Fax: [phone]

## 2015-08-03 DIAGNOSIS — R8290 Unspecified abnormal findings in urine: Secondary | ICD-10-CM | POA: Diagnosis not present

## 2015-08-03 DIAGNOSIS — N529 Male erectile dysfunction, unspecified: Secondary | ICD-10-CM | POA: Diagnosis not present

## 2015-08-03 DIAGNOSIS — Z23 Encounter for immunization: Secondary | ICD-10-CM | POA: Diagnosis not present

## 2015-08-03 DIAGNOSIS — M810 Age-related osteoporosis without current pathological fracture: Secondary | ICD-10-CM | POA: Diagnosis not present

## 2015-08-03 DIAGNOSIS — G4733 Obstructive sleep apnea (adult) (pediatric): Secondary | ICD-10-CM | POA: Diagnosis not present

## 2015-08-03 DIAGNOSIS — I10 Essential (primary) hypertension: Secondary | ICD-10-CM | POA: Diagnosis not present

## 2015-08-03 DIAGNOSIS — M899 Disorder of bone, unspecified: Secondary | ICD-10-CM | POA: Diagnosis not present

## 2015-08-03 DIAGNOSIS — Z125 Encounter for screening for malignant neoplasm of prostate: Secondary | ICD-10-CM | POA: Diagnosis not present

## 2015-08-03 DIAGNOSIS — Z0000 Encounter for general adult medical examination without abnormal findings: Secondary | ICD-10-CM | POA: Diagnosis not present

## 2015-08-03 DIAGNOSIS — N400 Enlarged prostate without lower urinary tract symptoms: Secondary | ICD-10-CM | POA: Diagnosis not present

## 2015-08-03 DIAGNOSIS — E785 Hyperlipidemia, unspecified: Secondary | ICD-10-CM | POA: Diagnosis not present

## 2015-08-03 DIAGNOSIS — L570 Actinic keratosis: Secondary | ICD-10-CM | POA: Diagnosis not present

## 2015-08-22 ENCOUNTER — Ambulatory Visit (HOSPITAL_COMMUNITY): Payer: Medicare Other

## 2015-08-24 ENCOUNTER — Encounter: Payer: Self-pay | Admitting: Cardiology

## 2015-08-25 ENCOUNTER — Other Ambulatory Visit (HOSPITAL_COMMUNITY): Payer: Self-pay | Admitting: Family Medicine

## 2015-08-25 ENCOUNTER — Ambulatory Visit (HOSPITAL_COMMUNITY)
Admission: RE | Admit: 2015-08-25 | Discharge: 2015-08-25 | Disposition: A | Discharge status: Home / Self Care | Payer: Medicare Other | Source: Ambulatory Visit | Attending: Family Medicine | Admitting: Family Medicine

## 2015-08-25 ENCOUNTER — Encounter (HOSPITAL_COMMUNITY): Payer: Self-pay

## 2015-08-25 DIAGNOSIS — M810 Age-related osteoporosis without current pathological fracture: Secondary | ICD-10-CM | POA: Insufficient documentation

## 2015-08-25 HISTORY — DX: Age-related osteoporosis without current pathological fracture: M81.0

## 2015-08-25 MED ORDER — SODIUM CHLORIDE 0.9 % IV SOLN
Freq: Once | INTRAVENOUS | Status: AC
  Administered 2015-08-25: 11:00:00 via INTRAVENOUS

## 2015-08-25 MED ORDER — ZOLEDRONIC ACID 5 MG/100ML IV SOLN
5.0000 mg | Freq: Once | INTRAVENOUS | Status: AC
  Administered 2015-08-25: 5 mg via INTRAVENOUS
  Filled 2015-08-25: qty 100

## 2015-08-25 NOTE — Discharge Instructions (Signed)
Zoledronic Acid injection (Paget's Disease, Osteoporosis) °What is this medicine? °ZOLEDRONIC ACID (ZOE le dron ik AS id) lowers the amount of calcium loss from bone. It is used to treat Paget's disease and osteoporosis in women. °This medicine may be used for other purposes; ask your health care provider or pharmacist if you have questions. °What should I tell my health care provider before I take this medicine? °They need to know if you have any of these conditions: °-aspirin-sensitive asthma °-cancer, especially if you are receiving medicines used to treat cancer °-dental disease or wear dentures °-infection °-kidney disease °-low levels of calcium in the blood °-past surgery on the parathyroid gland or intestines °-receiving corticosteroids like dexamethasone or prednisone °-an unusual or allergic reaction to zoledronic acid, other medicines, foods, dyes, or preservatives °-pregnant or trying to get pregnant °-breast-feeding °How should I use this medicine? °This medicine is for infusion into a vein. It is given by a health care professional in a hospital or clinic setting. °Talk to your pediatrician regarding the use of this medicine in children. This medicine is not approved for use in children. °Overdosage: If you think you have taken too much of this medicine contact a poison control center or emergency room at once. °NOTE: This medicine is only for you. Do not share this medicine with others. °What if I miss a dose? °It is important not to miss your dose. Call your doctor or health care professional if you are unable to keep an appointment. °What may interact with this medicine? °-certain antibiotics given by injection °-NSAIDs, medicines for pain and inflammation, like ibuprofen or naproxen °-some diuretics like bumetanide, furosemide °-teriparatide °This list may not describe all possible interactions. Give your health care provider a list of all the medicines, herbs, non-prescription drugs, or dietary  supplements you use. Also tell them if you smoke, drink alcohol, or use illegal drugs. Some items may interact with your medicine. °What should I watch for while using this medicine? °Visit your doctor or health care professional for regular checkups. It may be some time before you see the benefit from this medicine. Do not stop taking your medicine unless your doctor tells you to. Your doctor may order blood tests or other tests to see how you are doing. °Women should inform their doctor if they wish to become pregnant or think they might be pregnant. There is a potential for serious side effects to an unborn child. Talk to your health care professional or pharmacist for more information. °You should make sure that you get enough calcium and vitamin D while you are taking this medicine. Discuss the foods you eat and the vitamins you take with your health care professional. °Some people who take this medicine have severe bone, joint, and/or muscle pain. This medicine may also increase your risk for jaw problems or a broken thigh bone. Tell your doctor right away if you have severe pain in your jaw, bones, joints, or muscles. Tell your doctor if you have any pain that does not go away or that gets worse. °Tell your dentist and dental surgeon that you are taking this medicine. You should not have major dental surgery while on this medicine. See your dentist to have a dental exam and fix any dental problems before starting this medicine. Take good care of your teeth while on this medicine. Make sure you see your dentist for regular follow-up appointments. °What side effects may I notice from receiving this medicine? °Side effects that you should   report to your doctor or health care professional as soon as possible: -allergic reactions like skin rash, itching or hives, swelling of the face, lips, or tongue -anxiety, confusion, or depression -breathing problems -changes in vision -eye pain -feeling faint or  lightheaded, falls -jaw pain, especially after dental work -mouth sores -muscle cramps, stiffness, or weakness -redness, blistering, peeling or loosening of the skin, including inside the mouth -trouble passing urine or change in the amount of urine Side effects that usually do not require medical attention (report to your doctor or health care professional if they continue or are bothersome): -bone, joint, or muscle pain -constipation -diarrhea -fever -hair loss -irritation at site where injected -loss of appetite -nausea, vomiting -stomach upset -trouble sleeping -trouble swallowing -weak or tired This list may not describe all possible side effects. Call your doctor for medical advice about side effects. You may report side effects to FDA at 1-800-FDA-1088. Where should I keep my medicine? This drug is given in a hospital or clinic and will not be stored at home. NOTE: This sheet is a summary. It may not cover all possible information. If you have questions about this medicine, talk to your doctor, pharmacist, or health care provider.    2016, Elsevier/Gold Standard. (2014-03-20 14:19:                Take calcium 500 mg 2 x day,  Plus Vitamin D Or speak with MD about dietary supplement

## 2015-08-26 NOTE — Progress Notes (Signed)
Patient made aware of need for calcium plus D. States He and" MD have been going round and round" and he wasn't calling the doc about calcium. I will just go to my pharmacist and get what I need". Recommendations given.

## 2015-08-31 DIAGNOSIS — R42 Dizziness and giddiness: Secondary | ICD-10-CM | POA: Diagnosis not present

## 2016-03-28 DIAGNOSIS — D485 Neoplasm of uncertain behavior of skin: Secondary | ICD-10-CM | POA: Diagnosis not present

## 2016-03-28 DIAGNOSIS — Z85828 Personal history of other malignant neoplasm of skin: Secondary | ICD-10-CM | POA: Diagnosis not present

## 2016-03-28 DIAGNOSIS — L570 Actinic keratosis: Secondary | ICD-10-CM | POA: Diagnosis not present

## 2016-03-28 DIAGNOSIS — B078 Other viral warts: Secondary | ICD-10-CM | POA: Diagnosis not present

## 2016-03-28 DIAGNOSIS — T7840XA Allergy, unspecified, initial encounter: Secondary | ICD-10-CM | POA: Diagnosis not present

## 2016-03-28 DIAGNOSIS — L400 Psoriasis vulgaris: Secondary | ICD-10-CM | POA: Diagnosis not present

## 2016-03-28 DIAGNOSIS — D225 Melanocytic nevi of trunk: Secondary | ICD-10-CM | POA: Diagnosis not present

## 2016-03-28 DIAGNOSIS — D1801 Hemangioma of skin and subcutaneous tissue: Secondary | ICD-10-CM | POA: Diagnosis not present

## 2016-03-28 DIAGNOSIS — D2239 Melanocytic nevi of other parts of face: Secondary | ICD-10-CM | POA: Diagnosis not present

## 2016-07-24 ENCOUNTER — Encounter: Payer: Self-pay | Admitting: Cardiology

## 2016-08-04 ENCOUNTER — Encounter: Payer: Self-pay | Admitting: Cardiology

## 2016-08-06 ENCOUNTER — Encounter: Payer: Self-pay | Admitting: Cardiology

## 2016-08-06 ENCOUNTER — Encounter (INDEPENDENT_AMBULATORY_CARE_PROVIDER_SITE_OTHER): Payer: Self-pay

## 2016-08-06 ENCOUNTER — Ambulatory Visit (INDEPENDENT_AMBULATORY_CARE_PROVIDER_SITE_OTHER): Payer: Medicare Other | Admitting: Cardiology

## 2016-08-06 VITALS — BP 122/78 | HR 70 | Ht 71.0 in | Wt 163.0 lb

## 2016-08-06 DIAGNOSIS — I10 Essential (primary) hypertension: Secondary | ICD-10-CM

## 2016-08-06 DIAGNOSIS — G4733 Obstructive sleep apnea (adult) (pediatric): Secondary | ICD-10-CM

## 2016-08-06 NOTE — Progress Notes (Signed)
Cardiology Office Note    Date:  08/06/2016   ID:  Antonio [name], DOB 1941/03/01, MRN [id number]  PCP:  Gennette Pac, MD  Cardiologist:  Fransico Him, MD   Chief Complaint  Patient presents with  . Sleep Apnea  . Hypertension    History of Present Illness:  Antonio [name] is a 75 y.o. male with a history of OSA and HTN who presents today for followup. He is doing well. He tolerates his CPAP device without problems. He goes to bed around 8:30-9pm and wakes up frequently at night as early as 2am but goes back to sleep and gets up at 7am.  He wakes up frequently at night.   He uses a nasal mask with no chin strap. He feels the pressure is adequate. He has no nasal congestion but has had some mouth dryness.  He does not feel rested in the am compared to how he felt initially on CPAP.  He is very active once he is up in the am.     Past Medical History:  Diagnosis Date  . Diverticulitis    on colonoscopy in 2005 w Dr Wynetta Emery  . Dyslipidemia   . Eczema    BCC and AK followed by dermatologist Dr Ronnald Ramp Annually  . ED (erectile dysfunction)   . Hip fracture, left ([place])   . Hypertension   . OSA (obstructive sleep apnea)    primarily upper airway resistance syndrome, on CPAP at 11 cm H2O (PSG AHI 3.37/hr and RDI 46.5/hr) followed by Dr Radford Pax  . Osteoporosis     Past Surgical History:  Procedure Laterality Date  . COLONOSCOPY  2005  . TONSILLECTOMY     as child    Current Medications: Outpatient Medications Prior to Visit  Medication Sig Dispense Refill  . aspirin 81 MG tablet Take 81 mg by mouth daily.    . enalapril (VASOTEC) 10 MG tablet Take 10 mg by mouth daily.    . meloxicam (MOBIC) 15 MG tablet Take 15 mg by mouth daily.    . Multiple Vitamin (MULTIVITAMIN) tablet Take 1 tablet by mouth daily.    . sildenafil (VIAGRA) 100 MG tablet Take 100 mg by mouth daily as needed for erectile dysfunction.    . simvastatin (ZOCOR) 40 MG tablet Take 40 mg by mouth daily.      Marland Kitchen triamcinolone cream (KENALOG) 0.1 % Apply 1 application topically as needed.    Marland Kitchen UNABLE TO FIND CPAP     No facility-administered medications prior to visit.      Allergies:   Alendronate; Amoxicillin; and Hydrocodone   Social History   Social History  . Marital status: Divorced    Spouse name: N/A  . Number of children: N/A  . Years of education: N/A   Social History Main Topics  . Smoking status: Former Smoker    Quit date: 11/05/1994  . Smokeless tobacco: Never Used  . Alcohol use Yes     Comment: 1-2 daily beer and wine  . Drug use: No  . Sexual activity: Not Asked   Other Topics Concern  . None   Social History Narrative  . None     Family History:  The patient's family history includes Breast cancer in his mother; Dementia in his father; Ovarian cancer in his sister; Rectal cancer in his sister.   ROS:   Please see the history of present illness.    ROS All other systems reviewed and are negative.  No  flowsheet data found.     PHYSICAL EXAM:   VS:  BP 122/78   Pulse 70   Ht 5\' 11"  (1.803 m)   Wt 163 lb (73.9 kg)   BMI 22.73 kg/m    GEN: Well nourished, well developed, in no acute distress  HEENT: normal  Neck: no JVD, carotid bruits, or masses Cardiac: RRR; no murmurs, rubs, or gallops,no edema.  Intact distal pulses bilaterally.  Respiratory:  clear to auscultation bilaterally, normal work of breathing GI: soft, nontender, nondistended, + BS MS: no deformity or atrophy  Skin: warm and dry, no rash Neuro:  Alert and Oriented x 3, Strength and sensation are intact Psych: euthymic mood, full affect  Wt Readings from Last 3 Encounters:  08/06/16 163 lb (73.9 kg)  08/25/15 167 lb (75.8 kg)  07/27/15 163 lb 12.8 oz (74.3 kg)      Studies/Labs Reviewed:   EKG:  EKG is not ordered today.    Recent Labs: No results found for requested labs within last 8760 hours.   Lipid Panel No results found for: CHOL, TRIG, HDL, CHOLHDL, VLDL, LDLCALC,  LDLDIRECT  Additional studies/ records that were reviewed today include:  CPAP download    ASSESSMENT:    1. Obstructive sleep apnea   2. Essential hypertension, benign      PLAN:  In order of problems listed above:  OSA - the patient is tolerating PAP therapy well without any problems. The PAP download was reviewed today and showed an AHI of 1/hr on 11 cm H2O with 100% compliance in using more than 4 hours nightly.  The patient has been using and benefiting from CPAP use and will continue to benefit from therapy. He has some nonrestorative sleep that I think is related to sleep hygiene.  I encouraged him to aim to go to bed more around 10:30 -11pm to try to sleep through the night.  I have also recommended a chin strap to help with mouth dryness. 2.  HTN - BP controlled on current meds.  Continue ACE I    Medication Adjustments/Labs and Tests Ordered: Current medicines are reviewed at length with the patient today.  Concerns regarding medicines are outlined above.  Medication changes, Labs and Tests ordered today are listed in the Patient Instructions below.  There are no Patient Instructions on file for this visit.   Signed, Fransico Him, MD  08/06/2016 10:48 AM    [facility] [address], [place], [place]  [address] Phone: [phone]; Fax: [phone]

## 2016-08-06 NOTE — Patient Instructions (Signed)
Medication Instructions:  Your physician recommends that you continue on your current medications as directed. Please refer to the Current Medication list given to you today.   Labwork: None  Testing/Procedures: None  Follow-Up: Your physician wants you to follow-up in: 1 year with Dr. Radford Pax. You will receive a reminder letter in the mail two months in advance. If you don't receive a letter, please call our office to schedule the follow-up appointment.   Any Other Special Instructions Will Be Listed Below (If Applicable). Romelle Starcher, CMA is our office PAP assistant. Please call her directly at [phone] for all your PAP questions or concerns.     If you need a refill on your cardiac medications before your next appointment, please call your pharmacy.

## 2016-08-08 DIAGNOSIS — Z23 Encounter for immunization: Secondary | ICD-10-CM | POA: Diagnosis not present

## 2016-08-29 DIAGNOSIS — M899 Disorder of bone, unspecified: Secondary | ICD-10-CM | POA: Diagnosis not present

## 2016-08-29 DIAGNOSIS — Z0000 Encounter for general adult medical examination without abnormal findings: Secondary | ICD-10-CM | POA: Diagnosis not present

## 2016-08-29 DIAGNOSIS — R8290 Unspecified abnormal findings in urine: Secondary | ICD-10-CM | POA: Diagnosis not present

## 2016-08-29 DIAGNOSIS — M810 Age-related osteoporosis without current pathological fracture: Secondary | ICD-10-CM | POA: Diagnosis not present

## 2016-08-29 DIAGNOSIS — Z125 Encounter for screening for malignant neoplasm of prostate: Secondary | ICD-10-CM | POA: Diagnosis not present

## 2016-08-29 DIAGNOSIS — Z23 Encounter for immunization: Secondary | ICD-10-CM | POA: Diagnosis not present

## 2016-08-29 DIAGNOSIS — I10 Essential (primary) hypertension: Secondary | ICD-10-CM | POA: Diagnosis not present

## 2016-08-29 DIAGNOSIS — N529 Male erectile dysfunction, unspecified: Secondary | ICD-10-CM | POA: Diagnosis not present

## 2016-08-29 DIAGNOSIS — E785 Hyperlipidemia, unspecified: Secondary | ICD-10-CM | POA: Diagnosis not present

## 2016-08-29 DIAGNOSIS — N400 Enlarged prostate without lower urinary tract symptoms: Secondary | ICD-10-CM | POA: Diagnosis not present

## 2016-08-29 DIAGNOSIS — G4733 Obstructive sleep apnea (adult) (pediatric): Secondary | ICD-10-CM | POA: Diagnosis not present

## 2016-10-09 DIAGNOSIS — Z85828 Personal history of other malignant neoplasm of skin: Secondary | ICD-10-CM | POA: Diagnosis not present

## 2016-10-09 DIAGNOSIS — L308 Other specified dermatitis: Secondary | ICD-10-CM | POA: Diagnosis not present

## 2016-10-09 DIAGNOSIS — L400 Psoriasis vulgaris: Secondary | ICD-10-CM | POA: Diagnosis not present

## 2016-11-29 DIAGNOSIS — H04123 Dry eye syndrome of bilateral lacrimal glands: Secondary | ICD-10-CM | POA: Diagnosis not present

## 2016-11-29 DIAGNOSIS — M1990 Unspecified osteoarthritis, unspecified site: Secondary | ICD-10-CM | POA: Diagnosis not present

## 2016-12-13 DIAGNOSIS — H04123 Dry eye syndrome of bilateral lacrimal glands: Secondary | ICD-10-CM | POA: Diagnosis not present

## 2017-03-20 DIAGNOSIS — D2239 Melanocytic nevi of other parts of face: Secondary | ICD-10-CM | POA: Diagnosis not present

## 2017-03-20 DIAGNOSIS — D485 Neoplasm of uncertain behavior of skin: Secondary | ICD-10-CM | POA: Diagnosis not present

## 2017-03-20 DIAGNOSIS — L821 Other seborrheic keratosis: Secondary | ICD-10-CM | POA: Diagnosis not present

## 2017-03-20 DIAGNOSIS — D1801 Hemangioma of skin and subcutaneous tissue: Secondary | ICD-10-CM | POA: Diagnosis not present

## 2017-03-20 DIAGNOSIS — L570 Actinic keratosis: Secondary | ICD-10-CM | POA: Diagnosis not present

## 2017-03-20 DIAGNOSIS — D225 Melanocytic nevi of trunk: Secondary | ICD-10-CM | POA: Diagnosis not present

## 2017-03-20 DIAGNOSIS — L400 Psoriasis vulgaris: Secondary | ICD-10-CM | POA: Diagnosis not present

## 2017-03-20 DIAGNOSIS — Z85828 Personal history of other malignant neoplasm of skin: Secondary | ICD-10-CM | POA: Diagnosis not present

## 2017-09-02 DIAGNOSIS — E785 Hyperlipidemia, unspecified: Secondary | ICD-10-CM | POA: Diagnosis not present

## 2017-09-02 DIAGNOSIS — Z125 Encounter for screening for malignant neoplasm of prostate: Secondary | ICD-10-CM | POA: Diagnosis not present

## 2017-09-02 DIAGNOSIS — I10 Essential (primary) hypertension: Secondary | ICD-10-CM | POA: Diagnosis not present

## 2017-09-05 DIAGNOSIS — N529 Male erectile dysfunction, unspecified: Secondary | ICD-10-CM | POA: Diagnosis not present

## 2017-09-05 DIAGNOSIS — Z23 Encounter for immunization: Secondary | ICD-10-CM | POA: Diagnosis not present

## 2017-09-05 DIAGNOSIS — R946 Abnormal results of thyroid function studies: Secondary | ICD-10-CM | POA: Diagnosis not present

## 2017-09-05 DIAGNOSIS — N400 Enlarged prostate without lower urinary tract symptoms: Secondary | ICD-10-CM | POA: Diagnosis not present

## 2017-09-05 DIAGNOSIS — L570 Actinic keratosis: Secondary | ICD-10-CM | POA: Diagnosis not present

## 2017-09-05 DIAGNOSIS — I10 Essential (primary) hypertension: Secondary | ICD-10-CM | POA: Diagnosis not present

## 2017-09-05 DIAGNOSIS — E785 Hyperlipidemia, unspecified: Secondary | ICD-10-CM | POA: Diagnosis not present

## 2017-09-05 DIAGNOSIS — M810 Age-related osteoporosis without current pathological fracture: Secondary | ICD-10-CM | POA: Diagnosis not present

## 2017-09-05 DIAGNOSIS — Z0000 Encounter for general adult medical examination without abnormal findings: Secondary | ICD-10-CM | POA: Diagnosis not present

## 2017-09-05 DIAGNOSIS — G4733 Obstructive sleep apnea (adult) (pediatric): Secondary | ICD-10-CM | POA: Diagnosis not present

## 2017-11-07 DIAGNOSIS — M810 Age-related osteoporosis without current pathological fracture: Secondary | ICD-10-CM | POA: Diagnosis not present

## 2017-12-17 DIAGNOSIS — E785 Hyperlipidemia, unspecified: Secondary | ICD-10-CM | POA: Diagnosis not present

## 2017-12-17 DIAGNOSIS — M810 Age-related osteoporosis without current pathological fracture: Secondary | ICD-10-CM | POA: Diagnosis not present

## 2017-12-17 DIAGNOSIS — N400 Enlarged prostate without lower urinary tract symptoms: Secondary | ICD-10-CM | POA: Diagnosis not present

## 2017-12-17 DIAGNOSIS — N529 Male erectile dysfunction, unspecified: Secondary | ICD-10-CM | POA: Diagnosis not present

## 2017-12-17 DIAGNOSIS — R946 Abnormal results of thyroid function studies: Secondary | ICD-10-CM | POA: Diagnosis not present

## 2017-12-17 DIAGNOSIS — G4733 Obstructive sleep apnea (adult) (pediatric): Secondary | ICD-10-CM | POA: Diagnosis not present

## 2017-12-17 DIAGNOSIS — L570 Actinic keratosis: Secondary | ICD-10-CM | POA: Diagnosis not present

## 2017-12-17 DIAGNOSIS — Z0000 Encounter for general adult medical examination without abnormal findings: Secondary | ICD-10-CM | POA: Diagnosis not present

## 2017-12-17 DIAGNOSIS — R7989 Other specified abnormal findings of blood chemistry: Secondary | ICD-10-CM | POA: Diagnosis not present

## 2017-12-17 DIAGNOSIS — I10 Essential (primary) hypertension: Secondary | ICD-10-CM | POA: Diagnosis not present

## 2018-07-25 DIAGNOSIS — I10 Essential (primary) hypertension: Secondary | ICD-10-CM | POA: Diagnosis not present

## 2018-07-25 DIAGNOSIS — R413 Other amnesia: Secondary | ICD-10-CM | POA: Diagnosis not present

## 2018-07-25 DIAGNOSIS — Z23 Encounter for immunization: Secondary | ICD-10-CM | POA: Diagnosis not present

## 2018-07-25 DIAGNOSIS — R946 Abnormal results of thyroid function studies: Secondary | ICD-10-CM | POA: Diagnosis not present

## 2018-10-15 DIAGNOSIS — Z1389 Encounter for screening for other disorder: Secondary | ICD-10-CM | POA: Diagnosis not present

## 2018-10-15 DIAGNOSIS — E785 Hyperlipidemia, unspecified: Secondary | ICD-10-CM | POA: Diagnosis not present

## 2018-10-15 DIAGNOSIS — I10 Essential (primary) hypertension: Secondary | ICD-10-CM | POA: Diagnosis not present

## 2018-10-15 DIAGNOSIS — L570 Actinic keratosis: Secondary | ICD-10-CM | POA: Diagnosis not present

## 2018-10-15 DIAGNOSIS — G4733 Obstructive sleep apnea (adult) (pediatric): Secondary | ICD-10-CM | POA: Diagnosis not present

## 2018-10-15 DIAGNOSIS — M5416 Radiculopathy, lumbar region: Secondary | ICD-10-CM | POA: Diagnosis not present

## 2018-10-15 DIAGNOSIS — R946 Abnormal results of thyroid function studies: Secondary | ICD-10-CM | POA: Diagnosis not present

## 2018-10-15 DIAGNOSIS — N400 Enlarged prostate without lower urinary tract symptoms: Secondary | ICD-10-CM | POA: Diagnosis not present

## 2018-10-15 DIAGNOSIS — F0390 Unspecified dementia without behavioral disturbance: Secondary | ICD-10-CM | POA: Diagnosis not present

## 2018-10-15 DIAGNOSIS — M810 Age-related osteoporosis without current pathological fracture: Secondary | ICD-10-CM | POA: Diagnosis not present

## 2018-10-15 DIAGNOSIS — M1990 Unspecified osteoarthritis, unspecified site: Secondary | ICD-10-CM | POA: Diagnosis not present

## 2018-10-15 DIAGNOSIS — Z0000 Encounter for general adult medical examination without abnormal findings: Secondary | ICD-10-CM | POA: Diagnosis not present

## 2019-01-14 DIAGNOSIS — F0390 Unspecified dementia without behavioral disturbance: Secondary | ICD-10-CM | POA: Diagnosis not present

## 2019-07-31 DIAGNOSIS — F0390 Unspecified dementia without behavioral disturbance: Secondary | ICD-10-CM | POA: Diagnosis not present

## 2020-06-17 DIAGNOSIS — G4733 Obstructive sleep apnea (adult) (pediatric): Secondary | ICD-10-CM | POA: Diagnosis not present

## 2020-06-17 DIAGNOSIS — L570 Actinic keratosis: Secondary | ICD-10-CM | POA: Diagnosis not present

## 2020-06-17 DIAGNOSIS — Z23 Encounter for immunization: Secondary | ICD-10-CM | POA: Diagnosis not present

## 2020-06-17 DIAGNOSIS — N400 Enlarged prostate without lower urinary tract symptoms: Secondary | ICD-10-CM | POA: Diagnosis not present

## 2020-06-17 DIAGNOSIS — M810 Age-related osteoporosis without current pathological fracture: Secondary | ICD-10-CM | POA: Diagnosis not present

## 2020-06-17 DIAGNOSIS — I10 Essential (primary) hypertension: Secondary | ICD-10-CM | POA: Diagnosis not present

## 2020-06-17 DIAGNOSIS — F0390 Unspecified dementia without behavioral disturbance: Secondary | ICD-10-CM | POA: Diagnosis not present

## 2020-06-17 DIAGNOSIS — E785 Hyperlipidemia, unspecified: Secondary | ICD-10-CM | POA: Diagnosis not present

## 2020-07-21 DIAGNOSIS — R2689 Other abnormalities of gait and mobility: Secondary | ICD-10-CM | POA: Diagnosis not present

## 2020-07-21 DIAGNOSIS — F0390 Unspecified dementia without behavioral disturbance: Secondary | ICD-10-CM | POA: Diagnosis not present

## 2020-07-21 DIAGNOSIS — M818 Other osteoporosis without current pathological fracture: Secondary | ICD-10-CM | POA: Diagnosis not present

## 2020-07-21 DIAGNOSIS — R278 Other lack of coordination: Secondary | ICD-10-CM | POA: Diagnosis not present

## 2020-07-22 DIAGNOSIS — R2689 Other abnormalities of gait and mobility: Secondary | ICD-10-CM | POA: Diagnosis not present

## 2020-07-22 DIAGNOSIS — M25552 Pain in left hip: Secondary | ICD-10-CM | POA: Diagnosis not present

## 2020-07-26 DIAGNOSIS — R2689 Other abnormalities of gait and mobility: Secondary | ICD-10-CM | POA: Diagnosis not present

## 2020-07-26 DIAGNOSIS — M25552 Pain in left hip: Secondary | ICD-10-CM | POA: Diagnosis not present

## 2020-07-27 DIAGNOSIS — R2689 Other abnormalities of gait and mobility: Secondary | ICD-10-CM | POA: Diagnosis not present

## 2020-07-27 DIAGNOSIS — F0390 Unspecified dementia without behavioral disturbance: Secondary | ICD-10-CM | POA: Diagnosis not present

## 2020-07-27 DIAGNOSIS — R278 Other lack of coordination: Secondary | ICD-10-CM | POA: Diagnosis not present

## 2020-07-27 DIAGNOSIS — M818 Other osteoporosis without current pathological fracture: Secondary | ICD-10-CM | POA: Diagnosis not present

## 2020-07-28 DIAGNOSIS — R2689 Other abnormalities of gait and mobility: Secondary | ICD-10-CM | POA: Diagnosis not present

## 2020-07-28 DIAGNOSIS — M25552 Pain in left hip: Secondary | ICD-10-CM | POA: Diagnosis not present

## 2020-07-29 DIAGNOSIS — F0390 Unspecified dementia without behavioral disturbance: Secondary | ICD-10-CM | POA: Diagnosis not present

## 2020-07-29 DIAGNOSIS — R2689 Other abnormalities of gait and mobility: Secondary | ICD-10-CM | POA: Diagnosis not present

## 2020-07-29 DIAGNOSIS — R278 Other lack of coordination: Secondary | ICD-10-CM | POA: Diagnosis not present

## 2020-07-29 DIAGNOSIS — M818 Other osteoporosis without current pathological fracture: Secondary | ICD-10-CM | POA: Diagnosis not present

## 2020-08-02 DIAGNOSIS — M25552 Pain in left hip: Secondary | ICD-10-CM | POA: Diagnosis not present

## 2020-08-02 DIAGNOSIS — R2689 Other abnormalities of gait and mobility: Secondary | ICD-10-CM | POA: Diagnosis not present

## 2020-08-03 DIAGNOSIS — M818 Other osteoporosis without current pathological fracture: Secondary | ICD-10-CM | POA: Diagnosis not present

## 2020-08-03 DIAGNOSIS — F0390 Unspecified dementia without behavioral disturbance: Secondary | ICD-10-CM | POA: Diagnosis not present

## 2020-08-03 DIAGNOSIS — R2689 Other abnormalities of gait and mobility: Secondary | ICD-10-CM | POA: Diagnosis not present

## 2020-08-03 DIAGNOSIS — R278 Other lack of coordination: Secondary | ICD-10-CM | POA: Diagnosis not present

## 2020-08-04 DIAGNOSIS — F0390 Unspecified dementia without behavioral disturbance: Secondary | ICD-10-CM | POA: Diagnosis not present

## 2020-08-04 DIAGNOSIS — R278 Other lack of coordination: Secondary | ICD-10-CM | POA: Diagnosis not present

## 2020-08-04 DIAGNOSIS — R2689 Other abnormalities of gait and mobility: Secondary | ICD-10-CM | POA: Diagnosis not present

## 2020-08-04 DIAGNOSIS — M818 Other osteoporosis without current pathological fracture: Secondary | ICD-10-CM | POA: Diagnosis not present

## 2020-08-05 DIAGNOSIS — M25552 Pain in left hip: Secondary | ICD-10-CM | POA: Diagnosis not present

## 2020-08-05 DIAGNOSIS — R2689 Other abnormalities of gait and mobility: Secondary | ICD-10-CM | POA: Diagnosis not present

## 2020-08-08 DIAGNOSIS — R2689 Other abnormalities of gait and mobility: Secondary | ICD-10-CM | POA: Diagnosis not present

## 2020-08-08 DIAGNOSIS — M25552 Pain in left hip: Secondary | ICD-10-CM | POA: Diagnosis not present

## 2020-08-09 DIAGNOSIS — R2689 Other abnormalities of gait and mobility: Secondary | ICD-10-CM | POA: Diagnosis not present

## 2020-08-09 DIAGNOSIS — M25552 Pain in left hip: Secondary | ICD-10-CM | POA: Diagnosis not present

## 2020-08-10 DIAGNOSIS — F0390 Unspecified dementia without behavioral disturbance: Secondary | ICD-10-CM | POA: Diagnosis not present

## 2020-08-10 DIAGNOSIS — R2689 Other abnormalities of gait and mobility: Secondary | ICD-10-CM | POA: Diagnosis not present

## 2020-08-10 DIAGNOSIS — R278 Other lack of coordination: Secondary | ICD-10-CM | POA: Diagnosis not present

## 2020-08-10 DIAGNOSIS — M818 Other osteoporosis without current pathological fracture: Secondary | ICD-10-CM | POA: Diagnosis not present

## 2020-08-11 DIAGNOSIS — R278 Other lack of coordination: Secondary | ICD-10-CM | POA: Diagnosis not present

## 2020-08-11 DIAGNOSIS — F0390 Unspecified dementia without behavioral disturbance: Secondary | ICD-10-CM | POA: Diagnosis not present

## 2020-08-11 DIAGNOSIS — R2689 Other abnormalities of gait and mobility: Secondary | ICD-10-CM | POA: Diagnosis not present

## 2020-08-11 DIAGNOSIS — M818 Other osteoporosis without current pathological fracture: Secondary | ICD-10-CM | POA: Diagnosis not present

## 2020-08-16 DIAGNOSIS — M25552 Pain in left hip: Secondary | ICD-10-CM | POA: Diagnosis not present

## 2020-08-16 DIAGNOSIS — R2689 Other abnormalities of gait and mobility: Secondary | ICD-10-CM | POA: Diagnosis not present

## 2020-08-18 DIAGNOSIS — R278 Other lack of coordination: Secondary | ICD-10-CM | POA: Diagnosis not present

## 2020-08-18 DIAGNOSIS — M818 Other osteoporosis without current pathological fracture: Secondary | ICD-10-CM | POA: Diagnosis not present

## 2020-08-18 DIAGNOSIS — F0390 Unspecified dementia without behavioral disturbance: Secondary | ICD-10-CM | POA: Diagnosis not present

## 2020-08-18 DIAGNOSIS — R2689 Other abnormalities of gait and mobility: Secondary | ICD-10-CM | POA: Diagnosis not present

## 2020-08-19 DIAGNOSIS — R2689 Other abnormalities of gait and mobility: Secondary | ICD-10-CM | POA: Diagnosis not present

## 2020-08-19 DIAGNOSIS — M25552 Pain in left hip: Secondary | ICD-10-CM | POA: Diagnosis not present

## 2020-08-23 DIAGNOSIS — R2689 Other abnormalities of gait and mobility: Secondary | ICD-10-CM | POA: Diagnosis not present

## 2020-08-23 DIAGNOSIS — M818 Other osteoporosis without current pathological fracture: Secondary | ICD-10-CM | POA: Diagnosis not present

## 2020-08-23 DIAGNOSIS — F0390 Unspecified dementia without behavioral disturbance: Secondary | ICD-10-CM | POA: Diagnosis not present

## 2020-08-23 DIAGNOSIS — M25552 Pain in left hip: Secondary | ICD-10-CM | POA: Diagnosis not present

## 2020-08-23 DIAGNOSIS — R278 Other lack of coordination: Secondary | ICD-10-CM | POA: Diagnosis not present

## 2020-08-24 DIAGNOSIS — F0390 Unspecified dementia without behavioral disturbance: Secondary | ICD-10-CM | POA: Diagnosis not present

## 2020-08-24 DIAGNOSIS — F413 Other mixed anxiety disorders: Secondary | ICD-10-CM | POA: Diagnosis not present

## 2020-08-24 DIAGNOSIS — M818 Other osteoporosis without current pathological fracture: Secondary | ICD-10-CM | POA: Diagnosis not present

## 2020-08-24 DIAGNOSIS — R278 Other lack of coordination: Secondary | ICD-10-CM | POA: Diagnosis not present

## 2020-08-24 DIAGNOSIS — R2689 Other abnormalities of gait and mobility: Secondary | ICD-10-CM | POA: Diagnosis not present

## 2020-08-25 DIAGNOSIS — M25552 Pain in left hip: Secondary | ICD-10-CM | POA: Diagnosis not present

## 2020-08-25 DIAGNOSIS — R2689 Other abnormalities of gait and mobility: Secondary | ICD-10-CM | POA: Diagnosis not present

## 2020-08-30 DIAGNOSIS — R2689 Other abnormalities of gait and mobility: Secondary | ICD-10-CM | POA: Diagnosis not present

## 2020-08-30 DIAGNOSIS — F0151 Vascular dementia with behavioral disturbance: Secondary | ICD-10-CM | POA: Diagnosis not present

## 2020-08-30 DIAGNOSIS — M25552 Pain in left hip: Secondary | ICD-10-CM | POA: Diagnosis not present

## 2020-08-31 DIAGNOSIS — M818 Other osteoporosis without current pathological fracture: Secondary | ICD-10-CM | POA: Diagnosis not present

## 2020-08-31 DIAGNOSIS — R278 Other lack of coordination: Secondary | ICD-10-CM | POA: Diagnosis not present

## 2020-08-31 DIAGNOSIS — R2689 Other abnormalities of gait and mobility: Secondary | ICD-10-CM | POA: Diagnosis not present

## 2020-08-31 DIAGNOSIS — F0390 Unspecified dementia without behavioral disturbance: Secondary | ICD-10-CM | POA: Diagnosis not present

## 2020-09-01 DIAGNOSIS — R2689 Other abnormalities of gait and mobility: Secondary | ICD-10-CM | POA: Diagnosis not present

## 2020-09-01 DIAGNOSIS — M818 Other osteoporosis without current pathological fracture: Secondary | ICD-10-CM | POA: Diagnosis not present

## 2020-09-01 DIAGNOSIS — R278 Other lack of coordination: Secondary | ICD-10-CM | POA: Diagnosis not present

## 2020-09-01 DIAGNOSIS — F0390 Unspecified dementia without behavioral disturbance: Secondary | ICD-10-CM | POA: Diagnosis not present

## 2020-09-07 DIAGNOSIS — F0390 Unspecified dementia without behavioral disturbance: Secondary | ICD-10-CM | POA: Diagnosis not present

## 2020-09-07 DIAGNOSIS — F413 Other mixed anxiety disorders: Secondary | ICD-10-CM | POA: Diagnosis not present

## 2020-09-21 DIAGNOSIS — F39 Unspecified mood [affective] disorder: Secondary | ICD-10-CM | POA: Diagnosis not present

## 2020-09-21 DIAGNOSIS — F0390 Unspecified dementia without behavioral disturbance: Secondary | ICD-10-CM | POA: Diagnosis not present

## 2020-10-19 DIAGNOSIS — F0390 Unspecified dementia without behavioral disturbance: Secondary | ICD-10-CM | POA: Diagnosis not present

## 2020-10-19 DIAGNOSIS — F39 Unspecified mood [affective] disorder: Secondary | ICD-10-CM | POA: Diagnosis not present

## 2020-10-25 DIAGNOSIS — F0151 Vascular dementia with behavioral disturbance: Secondary | ICD-10-CM | POA: Diagnosis not present

## 2020-10-25 DIAGNOSIS — E039 Hypothyroidism, unspecified: Secondary | ICD-10-CM | POA: Diagnosis not present

## 2020-11-30 ENCOUNTER — Encounter (HOSPITAL_COMMUNITY): Payer: Self-pay | Admitting: Emergency Medicine

## 2020-11-30 ENCOUNTER — Emergency Department (HOSPITAL_COMMUNITY): Payer: Medicare Other

## 2020-11-30 ENCOUNTER — Emergency Department (HOSPITAL_COMMUNITY)
Admission: EM | Admit: 2020-11-30 | Discharge: 2020-12-01 | Disposition: A | Discharge status: Home / Self Care | Payer: Medicare Other | Attending: Emergency Medicine | Admitting: Emergency Medicine

## 2020-11-30 DIAGNOSIS — G8929 Other chronic pain: Secondary | ICD-10-CM | POA: Diagnosis not present

## 2020-11-30 DIAGNOSIS — Z87891 Personal history of nicotine dependence: Secondary | ICD-10-CM | POA: Insufficient documentation

## 2020-11-30 DIAGNOSIS — W19XXXA Unspecified fall, initial encounter: Secondary | ICD-10-CM | POA: Diagnosis not present

## 2020-11-30 DIAGNOSIS — Y92129 Unspecified place in nursing home as the place of occurrence of the external cause: Secondary | ICD-10-CM | POA: Diagnosis not present

## 2020-11-30 DIAGNOSIS — R918 Other nonspecific abnormal finding of lung field: Secondary | ICD-10-CM | POA: Insufficient documentation

## 2020-11-30 DIAGNOSIS — F0390 Unspecified dementia without behavioral disturbance: Secondary | ICD-10-CM | POA: Diagnosis not present

## 2020-11-30 DIAGNOSIS — I10 Essential (primary) hypertension: Secondary | ICD-10-CM | POA: Diagnosis not present

## 2020-11-30 DIAGNOSIS — Z7982 Long term (current) use of aspirin: Secondary | ICD-10-CM | POA: Insufficient documentation

## 2020-11-30 DIAGNOSIS — M25552 Pain in left hip: Secondary | ICD-10-CM

## 2020-11-30 DIAGNOSIS — J189 Pneumonia, unspecified organism: Secondary | ICD-10-CM

## 2020-11-30 DIAGNOSIS — Z79899 Other long term (current) drug therapy: Secondary | ICD-10-CM | POA: Insufficient documentation

## 2020-11-30 MED ORDER — LORAZEPAM 2 MG/ML IJ SOLN
1.0000 mg | Freq: Once | INTRAMUSCULAR | Status: AC
  Administered 2020-11-30: 1 mg via INTRAMUSCULAR
  Filled 2020-11-30: qty 1

## 2020-11-30 MED ORDER — ACETAMINOPHEN 325 MG PO TABS: 650.0000 mg | ORAL_TABLET | Freq: Once | ORAL | Status: DC

## 2020-11-30 NOTE — ED Triage Notes (Signed)
Patient BIB [facility] from [facility] after fall, history of dementia. Left upper leg pain. DNR form with patient.

## 2020-11-30 NOTE — ED Provider Notes (Signed)
[facility] EMERGENCY DEPARTMENT Provider Note   CSN: [id number] Arrival date & time: 11/30/20  1840     History Chief Complaint  Patient presents with  . Fall    Antonio [name] is a 80 y.o. male.  Patient with history of dementia presents to the ER for report of fall.  Complaining of left hip pain.  Patient has report of his chronic hip pain there but worse after the fall today.  Unable to ascertain if he had loss of consciousness.  Patient self states he has no headache or neck pain.        Past Medical History:  Diagnosis Date  . Diverticulitis    on colonoscopy in 2005 w Dr Wynetta Emery  . Dyslipidemia   . Eczema    BCC and AK followed by dermatologist Dr Ronnald Ramp Annually  . ED (erectile dysfunction)   . Hip fracture, left ([place])   . Hypertension   . OSA (obstructive sleep apnea)    primarily upper airway resistance syndrome, on CPAP at 11 cm H2O (PSG AHI 3.37/hr and RDI 46.5/hr) followed by Dr Radford Pax  . Osteoporosis     Patient Active Problem List   Diagnosis Date Noted  . Obstructive sleep apnea 01/14/2014  . Essential hypertension, benign 01/14/2014  . HIP PAIN, LEFT 06/08/2008  . GAIT DISTURBANCE 06/08/2008    Past Surgical History:  Procedure Laterality Date  . COLONOSCOPY  2005  . TONSILLECTOMY     as child       Family History  Problem Relation Age of Onset  . Breast cancer Mother   . Dementia Father   . Rectal cancer Sister   . Ovarian cancer Sister     Social History   Tobacco Use  . Smoking status: Former Smoker    Quit date: 11/05/1994    Years since quitting: 26.0  . Smokeless tobacco: Never Used  Substance Use Topics  . Alcohol use: Yes    Comment: 1-2 daily beer and wine  . Drug use: No    Home Medications Prior to Admission medications   Medication Sig Start Date End Date Taking? Authorizing Provider  aspirin 81 MG tablet Take 81 mg by mouth daily.    [provider]  enalapril (VASOTEC) 10 MG tablet  Take 10 mg by mouth daily.    [provider]  meloxicam (MOBIC) 15 MG tablet Take 15 mg by mouth daily. 07/08/14   [provider]  Multiple Vitamin (MULTIVITAMIN) tablet Take 1 tablet by mouth daily.    [provider]  sildenafil (VIAGRA) 100 MG tablet Take 100 mg by mouth daily as needed for erectile dysfunction.    [provider]  simvastatin (ZOCOR) 40 MG tablet Take 40 mg by mouth daily.    [provider]  triamcinolone cream (KENALOG) 0.1 % Apply 1 application topically as needed.    [provider]  UNABLE TO FIND CPAP    [provider]    Allergies    Alendronate, Amoxicillin, and Hydrocodone  Review of Systems   Review of Systems  Unable to perform ROS: Dementia    Physical Exam Updated Vital Signs BP 140/72 (BP Location: Right Arm)   Pulse 84   Temp 98.1 F (36.7 C) (Oral)   Resp 16   SpO2 97%   Physical Exam Constitutional:      General: He is not in acute distress.    Appearance: He is well-developed.  HENT:  Head: Normocephalic.     Nose: Nose normal.  Eyes:     Extraocular Movements: Extraocular movements intact.  Cardiovascular:     Rate and Rhythm: Normal rate.  Pulmonary:     Effort: Pulmonary effort is normal.  Musculoskeletal:     Comments: No CT or L-spine tenderness or step-offs noted.  Patient has moderate discomfort with attempted range of motion of the left hip.  But no distal neurovascular compromise noted.  Skin:    Coloration: Skin is not jaundiced.  Neurological:     General: No focal deficit present.     Mental Status: He is alert. Mental status is at baseline.     ED Results / Procedures / Treatments   Labs (all labs ordered are listed, but only abnormal results are displayed) Labs Reviewed - No data to display  EKG None  Radiology DG Chest 1 View  Result Date: 11/30/2020 CLINICAL DATA:  80 year old male with preop chest radiograph. EXAM: CHEST  1 VIEW  COMPARISON:  None. FINDINGS: Small left pleural effusion and left lung base atelectasis or infiltrate. Focal rounded opacity at the right lung base measures approximately 6 cm in diameter. No pneumothorax. Borderline cardiomegaly. Atherosclerotic calcification of the aorta. No acute osseous pathology. IMPRESSION: 1. Small left pleural effusion and left lung base atelectasis or infiltrate. 2. Focal rounded opacity at the right lung base may represent rounded atelectasis, pneumonia, or mass. Clinical correlation and follow-up to resolution or further evaluation with CT is recommended. Electronically Signed   By: Anner Crete M.D.   On: 11/30/2020 19:25   DG Pelvis 1-2 Views  Result Date: 11/30/2020 CLINICAL DATA:  80 year old male with left hip pain. EXAM: LEFT FEMUR 2 VIEWS; PELVIS - 1-2 VIEW COMPARISON:  Left hip radiograph dated 06/08/2008. FINDINGS: No acute fracture or dislocation. There is advanced osteopenia. 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 Moderate arthritic changes of the right hip. The soft tissues are unremarkable. IMPRESSION: 1. No acute fracture or dislocation. 2. 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 Electronically Signed   By: Anner Crete M.D.   On: 11/30/2020 19:23   DG FEMUR MIN 2 VIEWS LEFT  Result Date: 11/30/2020 CLINICAL DATA:  81 year old male with left hip pain. EXAM: LEFT FEMUR 2 VIEWS; PELVIS - 1-2 VIEW COMPARISON:  Left hip radiograph dated 06/08/2008. FINDINGS: No acute fracture or dislocation. There is advanced osteopenia. 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 Moderate arthritic changes of the right hip. The soft tissues are unremarkable. IMPRESSION: 1. No acute fracture or dislocation. 2. 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 Electronically Signed   By: Anner Crete M.D.   On: 11/30/2020 19:23    Procedures Procedures  ratio  Medications Ordered in ED Medications  acetaminophen (TYLENOL) tablet 650 mg (has no administration in time range)  LORazepam (ATIVAN) injection 1 mg (1 mg Intramuscular Given 11/30/20 2314)    ED Course  I have reviewed the triage vital signs and the nursing notes.  Pertinent labs & imaging results that were available during my care of the patient were reviewed by me and considered in my medical decision making (see chart for details).    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  Given his persistent pain CT imaging pursued.  Patient with history of dementia not cooperative with CT imaging, Haldol considered, EKG shows however QT prolongation at 508.  Opted to give the patient Ativan 1 mg IM.  CT imaging pursued and results pending.  Signed out to oncoming physician provider.   Final Clinical Impression(s) / ED Diagnoses Final diagnoses:  Fall    Rx / DC Orders ED Discharge Orders    None       Luna Fuse, MD 11/30/20 2349

## 2020-11-30 NOTE — ED Notes (Signed)
Daughter and power of attorney would like a call to give any update for his medical history, [phone], Claiborne Billings. She states that she is 5 hours away but on her way to the hospital.

## 2020-11-30 NOTE — ED Provider Notes (Incomplete)
Care assumed from Dr. Almyra Free, patient with fall at nursing home and left hip pain.  X-rays showed no clear fracture, but patient unable to ambulate.  He is pending CT of pelvis and head.  CT scans show no evidence of hip fracture or pelvis fracture, no acute intracranial process.  However, it is noted on his chest x-ray, there is a rounded mass in the right base which is felt to possibly represent pneumonia but possibly atelectasis and possibly tumor.  He is sent for CT to evaluate this further.    CT does appear to show pneumonia.  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  He is discharged back to his skilled nursing center with a prescription for cefdinir for pneumonia.  No results found for this or any previous visit. DG Chest 1 View  Result Date: 11/30/2020 CLINICAL DATA:  80 year old male with preop chest radiograph. EXAM: CHEST  1 VIEW COMPARISON:  None. FINDINGS: Small left pleural effusion and left lung base atelectasis or infiltrate. Focal rounded opacity at the right lung base measures approximately 6 cm in diameter. No pneumothorax. Borderline cardiomegaly. Atherosclerotic calcification of the aorta. No acute osseous pathology. IMPRESSION: 1. Small left pleural effusion and left lung base atelectasis or infiltrate. 2. Focal rounded opacity at the right lung base may represent rounded atelectasis, pneumonia, or mass. Clinical correlation and follow-up to resolution or further evaluation with CT is recommended. Electronically Signed   By: Anner Crete M.D.   On: 11/30/2020 19:25   DG Pelvis 1-2 Views  Result Date: 11/30/2020 CLINICAL DATA:  80 year old male with left hip pain. EXAM: LEFT FEMUR 2 VIEWS; PELVIS - 1-2 VIEW COMPARISON:  Left hip radiograph dated 06/08/2008. FINDINGS: No acute fracture or dislocation. There is advanced osteopenia. 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 Moderate arthritic changes of the right hip.  The soft tissues are unremarkable. IMPRESSION: 1. No acute fracture or dislocation. 2. 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 Electronically Signed   By: Anner Crete M.D.   On: 11/30/2020 19:23   CT Head Wo Contrast  Result Date: 12/01/2020 CLINICAL DATA:  Dementia, fall, head injury EXAM: CT HEAD WITHOUT CONTRAST TECHNIQUE: Contiguous axial images were obtained from the base of the skull through the vertex without intravenous contrast. COMPARISON:  None. FINDINGS: Brain: Normal anatomic configuration. Moderate parenchymal volume loss is commensurate with the patient's age. Mild to moderate periventricular white matter changes are present likely reflecting the sequela of small vessel ischemia. 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 No abnormal intra or extra-axial mass lesion or fluid collection. No abnormal mass effect or midline shift. No evidence of acute intracranial hemorrhage or infarct. Ventricular size is normal. Cerebellum unremarkable. Vascular: No asymmetric hyperdense vasculature at the skull base. Moderate atherosclerotic calcification within the carotid siphons Skull: Intact Sinuses/Orbits: Paranasal sinuses are clear save for mild layering mucus within the left maxillary sinus. Orbits are unremarkable. Other: Mastoid air cells and middle ear cavities are clear. IMPRESSION: No acute intracranial abnormality. Moderate senescent change. Remote infarcts as outlined above. Electronically Signed   By: Fidela Salisbury MD   On: 12/01/2020 00:02   CT Chest Wo Contrast  Result Date: 12/01/2020 CLINICAL DATA:  Abnormal x-ray.  Lung nodule. EXAM: CT CHEST WITHOUT CONTRAST TECHNIQUE: Multidetector CT imaging of the chest was performed following the standard protocol without IV contrast. COMPARISON:  Chest radiograph 11/30/2020 FINDINGS: Cardiovascular: Cardiac enlargement. Minimal pericardial  effusion. Coronary artery and aortic calcification. No aortic aneurysm.  Mediastinum/Nodes: Esophagus is decompressed. No significant lymphadenopathy. Lungs/Pleura: Small bilateral pleural effusions. Consolidation in the lung bases, greater on the left, likely pneumonia. There is a Bochdalek's type hernia on the right containing fat and measuring 6.2 cm in diameter. This corresponds to the masslike density seen in the right lung base on the previous chest radiograph. Upper Abdomen: No acute abnormalities demonstrated in the visualized upper abdomen. Musculoskeletal: Old right rib fracture. No destructive bone lesions. Benign-appearing sclerosis in the T12 vertebra. IMPRESSION: 1. Cardiac enlargement with minimal pericardial effusion. 2. Small bilateral pleural effusions with basilar consolidation, greater on the left, likely pneumonia. 3. 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 This corresponds to the masslike density seen in the right lung base on the previous chest radiograph. 4. Aortic atherosclerosis. Aortic Atherosclerosis (ICD10-I70.0). Electronically Signed   By: Lucienne Capers M.D.   On: 12/01/2020 02:19   CT PELVIS WO CONTRAST  Result Date: 12/01/2020 CLINICAL DATA:  Fall, left hip pain EXAM: CT PELVIS WITHOUT CONTRAST TECHNIQUE: Multidetector CT imaging of the pelvis was performed following the standard protocol without intravenous contrast. COMPARISON:  None. FINDINGS: Urinary Tract: The bladder is largely decompressed, however, the bladder wall is circumferentially thickened suggesting changes of at least mild chronic bladder outlet obstruction. Bowel: There is extensive sigmoid diverticulosis noted without superimposed inflammatory change. The visualized large and small bowel are otherwise unremarkable. No free intraperitoneal fluid. Vascular/Lymphatic: Moderate aortoiliac atherosclerotic calcification. No aortic aneurysm. No pathologic adenopathy within the abdomen and pelvis. Reproductive:  Prostate gland and seminal vesicles are unremarkable. Other:  No  abdominal wall hernia identified.  Rectum unremarkable Musculoskeletal: There is severe asymmetric left hip degenerative arthritis with complete loss of the joint space and extensive subchondral cyst formation within the acetabular roof and left femoral head. Mild to moderate right hip degenerative arthritis. Degenerative changes are seen at the lumbosacral junction. No acute fracture or dislocation identified. No lytic or blastic bone lesion. Extensive degenerative enthesopathy in the valve in the insertion of the iliopsoas tendon upon the lesser trochanter. IMPRESSION: No acute fracture or dislocation. Severe asymmetric left hip degenerative arthritis. Severe sigmoid diverticulosis without evidence of diverticulitis. Circumferential bladder wall thickening suggesting changes of at least mild bladder outlet obstruction. The bladder is not distended. Peripheral vascular disease. Aortic Atherosclerosis (ICD10-I70.0). Electronically Signed   By: Fidela Salisbury MD   On: 12/01/2020 00:09   DG FEMUR MIN 2 VIEWS LEFT  Result Date: 11/30/2020 CLINICAL DATA:  80 year old male with left hip pain. EXAM: LEFT FEMUR 2 VIEWS; PELVIS - 1-2 VIEW COMPARISON:  Left hip radiograph dated 06/08/2008. FINDINGS: No acute fracture or dislocation. There is advanced osteopenia. 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 Moderate arthritic changes of the right hip. The soft tissues are unremarkable. IMPRESSION: 1. No acute fracture or dislocation. 2. 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 Electronically Signed   By: Anner Crete M.D.   On: 11/30/2020 19:23   Images viewed by me.    Delora Fuel, MD 44/01/02 [id number]    Delora Fuel, MD 66/44/03 [phone]

## 2020-12-01 ENCOUNTER — Emergency Department (HOSPITAL_COMMUNITY): Payer: Medicare Other

## 2020-12-01 DIAGNOSIS — M25552 Pain in left hip: Secondary | ICD-10-CM | POA: Diagnosis not present

## 2020-12-01 MED ORDER — CEFDINIR 300 MG PO CAPS
300.0000 mg | ORAL_CAPSULE | Freq: Once | ORAL | Status: AC
  Administered 2020-12-01: 300 mg via ORAL
  Filled 2020-12-01: qty 1

## 2020-12-01 MED ORDER — CEFDINIR 300 MG PO CAPS: 300.0000 mg | ORAL_CAPSULE | Freq: Two times a day (BID) | ORAL | 0 refills | Status: DC

## 2020-12-01 MED ORDER — CEFDINIR 300 MG PO CAPS: 300.0000 mg | ORAL_CAPSULE | Freq: Two times a day (BID) | ORAL | 0 refills | Status: AC

## 2020-12-01 NOTE — ED Notes (Signed)
Report given to Judson Roch at [facility].

## 2020-12-01 NOTE — ED Notes (Signed)
Called [facility] to transport patient to [facility] [place].

## 2020-12-01 NOTE — Discharge Instructions (Addendum)
If hip pain does not improve, follow up with orthopedics.  If you develop difficulty breathing or high fever, return to the emergency department.

## 2021-02-04 IMAGING — CT CT PELVIS W/O CM
2 of 3 series · 17 of 46 positions shown, 19 images · non-contrast
Comparison: None.

CLINICAL DATA: Fall, left hip pain

EXAM:
CT PELVIS WITHOUT CONTRAST
TECHNIQUE: Multidetector CT imaging of the pelvis was performed following the
standard protocol without intravenous contrast.

[Series 5: pelvis 2.0 st · axial · 0.84mm/px · z∈[+567,+863]mm · 14 of 172 slices shown, 16 images]
[im 12/172  soft-tissue]
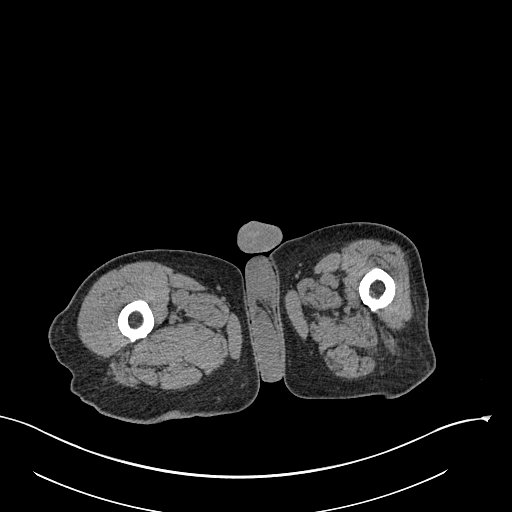
[im 12/172  bone]
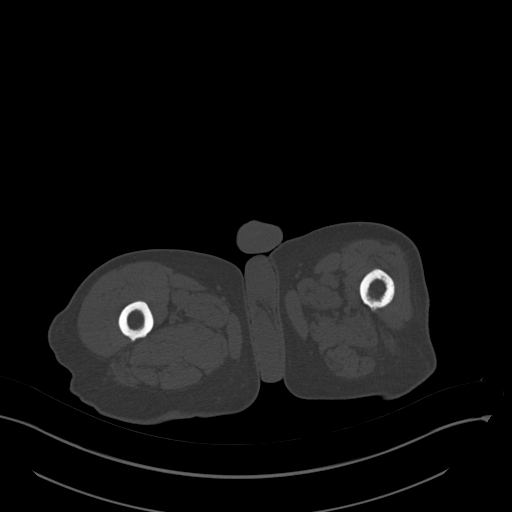
[im 23/172  soft-tissue]
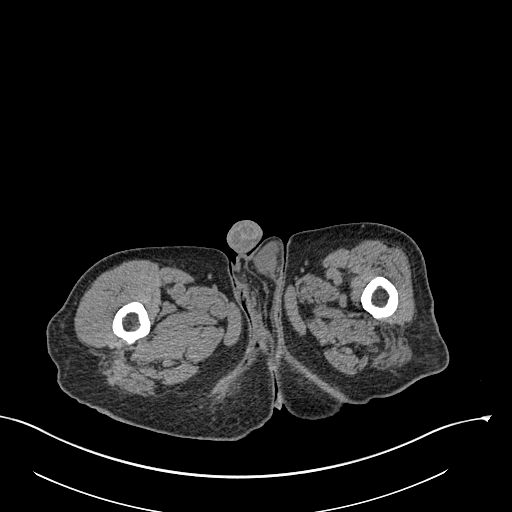
[im 34/172  soft-tissue]
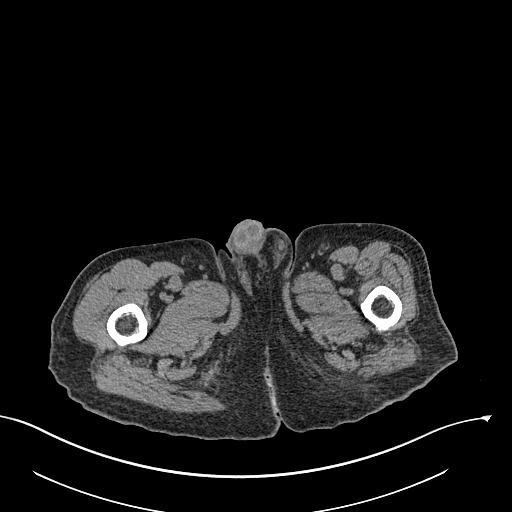
[im 45/172  soft-tissue]
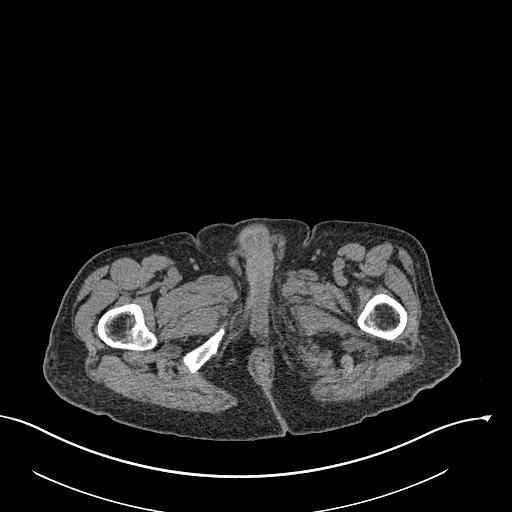
[im 56/172  soft-tissue]
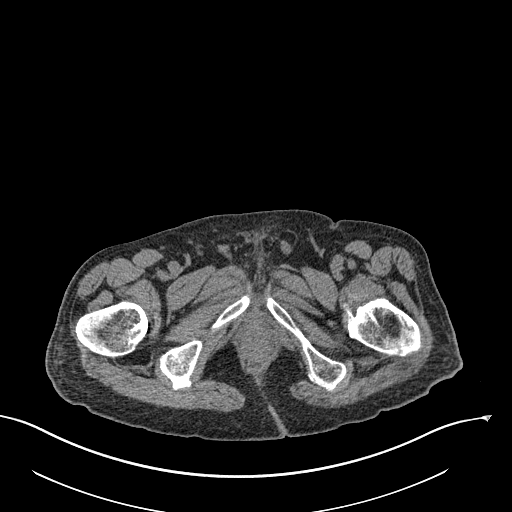
[im 67/172  soft-tissue]
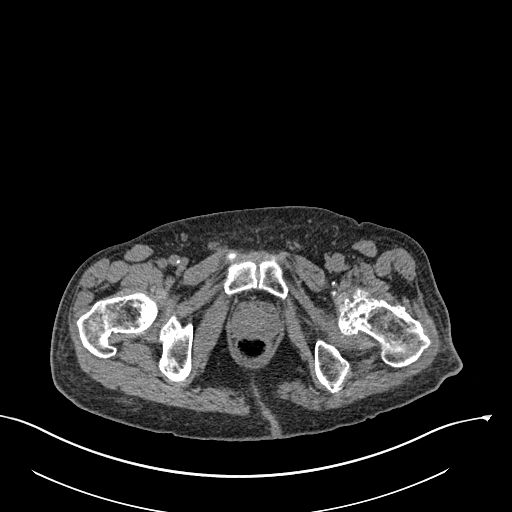
[im 78/172  soft-tissue]
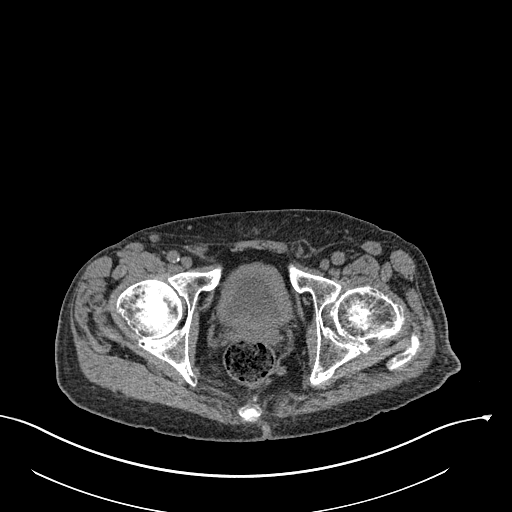
[im 94/172  soft-tissue]
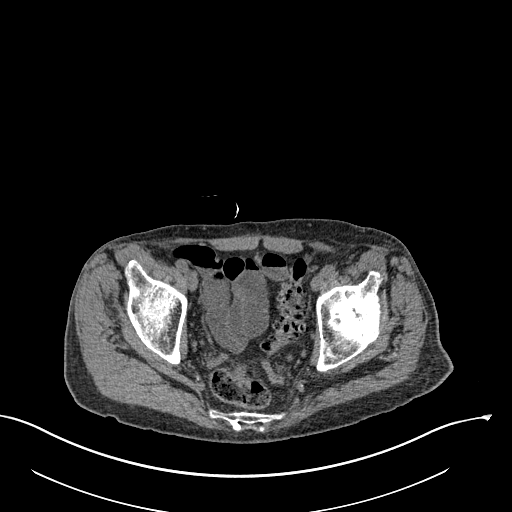
[im 105/172  soft-tissue]
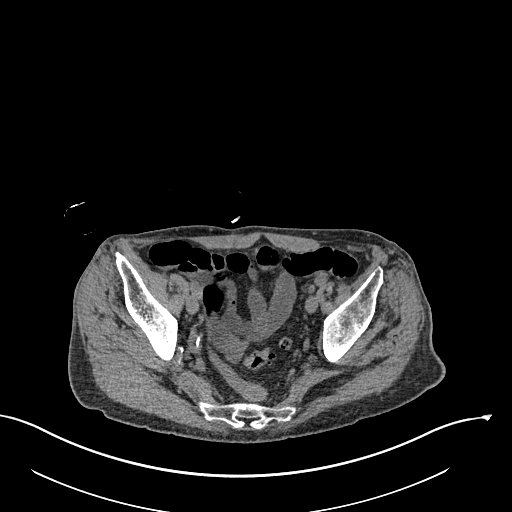
[im 105/172  bone]
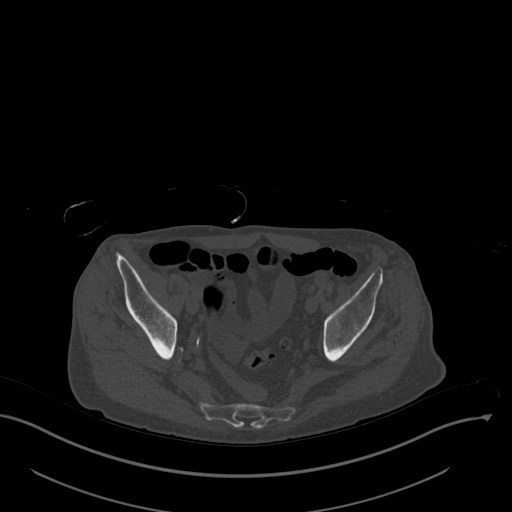
[im 116/172  soft-tissue]
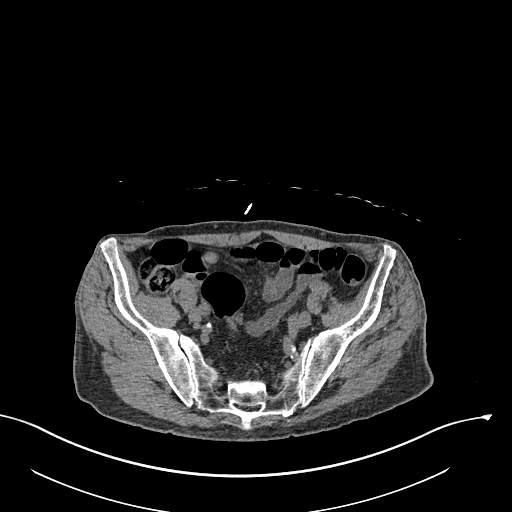
[im 127/172  soft-tissue]
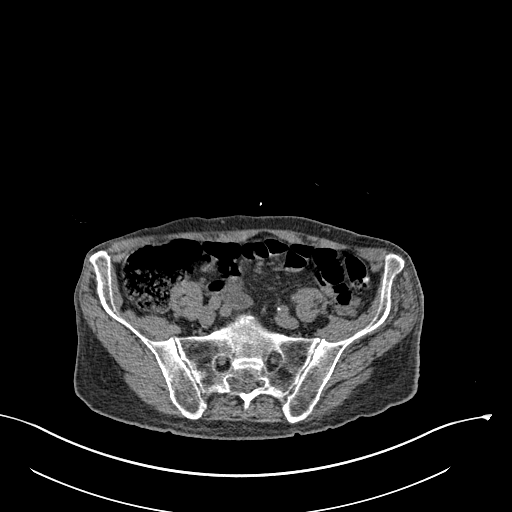
[im 138/172  soft-tissue]
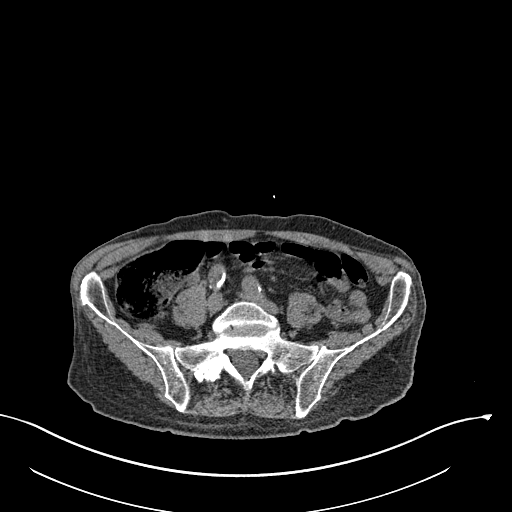
[im 149/172  soft-tissue]
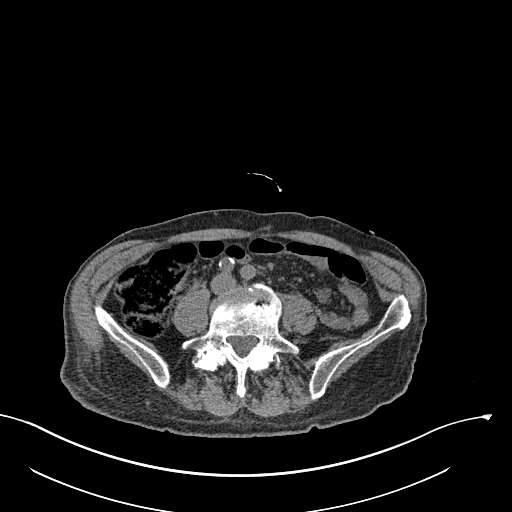
[im 160/172  soft-tissue]
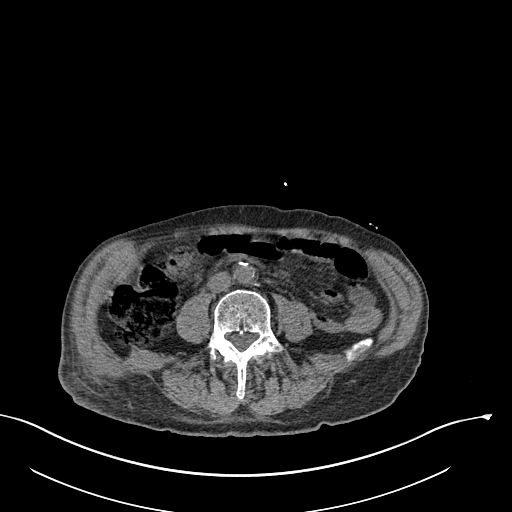

[Series 6: pelvis 2.0 cor. bone · coronal · 0.67mm/px · 3 of 107 slices shown]
[im 36/107  soft-tissue]
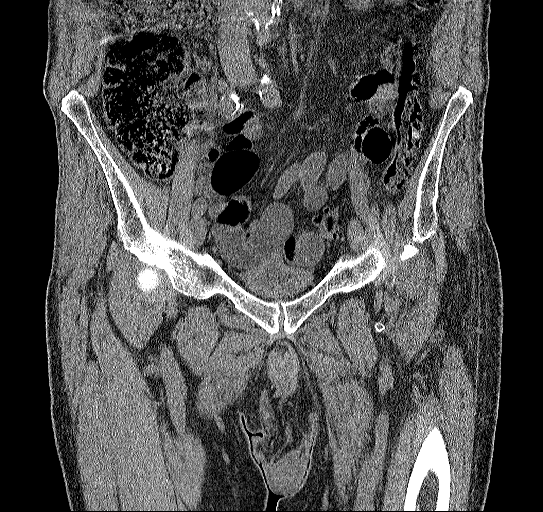
[im 48/107  soft-tissue]
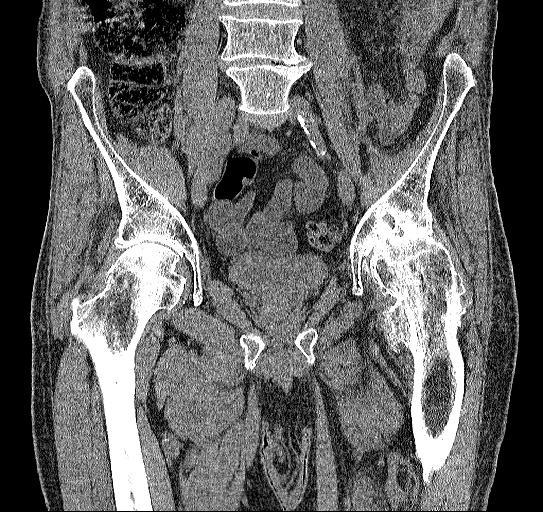
[im 59/107  soft-tissue]
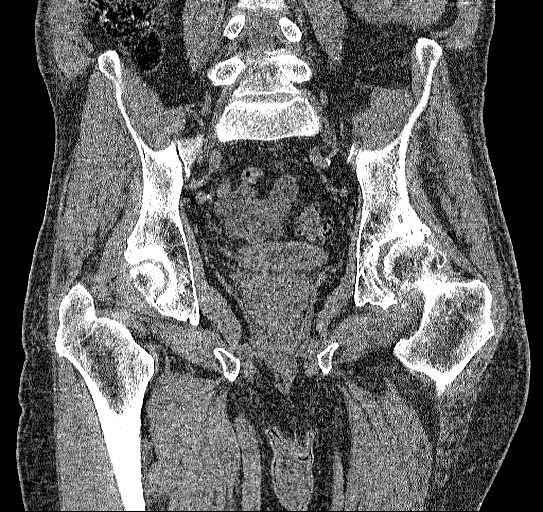

[17 of 46 positions shown; findings below may reference images not displayed]

FINDINGS: Urinary Tract: The bladder is largely decompressed, however, the
bladder wall is circumferentially thickened suggesting changes of at
least mild chronic bladder outlet obstruction.

Bowel: There is extensive sigmoid diverticulosis noted without
superimposed inflammatory change. The visualized large and small
bowel are otherwise unremarkable. No free intraperitoneal fluid.

Vascular/Lymphatic: Moderate aortoiliac atherosclerotic
calcification. No aortic aneurysm. No pathologic adenopathy within
the abdomen and pelvis.

Reproductive:  Prostate gland and seminal vesicles are unremarkable.

Other:  No abdominal wall hernia identified.  Rectum unremarkable

Musculoskeletal: There is severe asymmetric left hip degenerative
arthritis with complete loss of the joint space and extensive
subchondral cyst formation within the acetabular roof and left
femoral head. Mild to moderate right hip degenerative arthritis.
Degenerative changes are seen at the lumbosacral junction. No acute
fracture or dislocation identified. No lytic or blastic bone lesion.
Extensive degenerative enthesopathy in the valve in the insertion of
the iliopsoas tendon upon the lesser trochanter.
IMPRESSION: No acute fracture or dislocation.

Severe asymmetric left hip degenerative arthritis.

Severe sigmoid diverticulosis without evidence of diverticulitis.

Circumferential bladder wall thickening suggesting changes of at
least mild bladder outlet obstruction. The bladder is not distended.

Peripheral vascular disease.

Aortic Atherosclerosis ([id number]).

## 2021-02-04 IMAGING — CR DG FEMUR 2+V*L*
4 series · 4 of 4 positions shown · non-contrast
Comparison: Left hip radiograph dated 06/08/2008.

CLINICAL DATA: 79-year-old male with left hip pain.

EXAM:
LEFT FEMUR 2 VIEWS; PELVIS - 1-2 VIEW

[femur ap (1 of 2)]
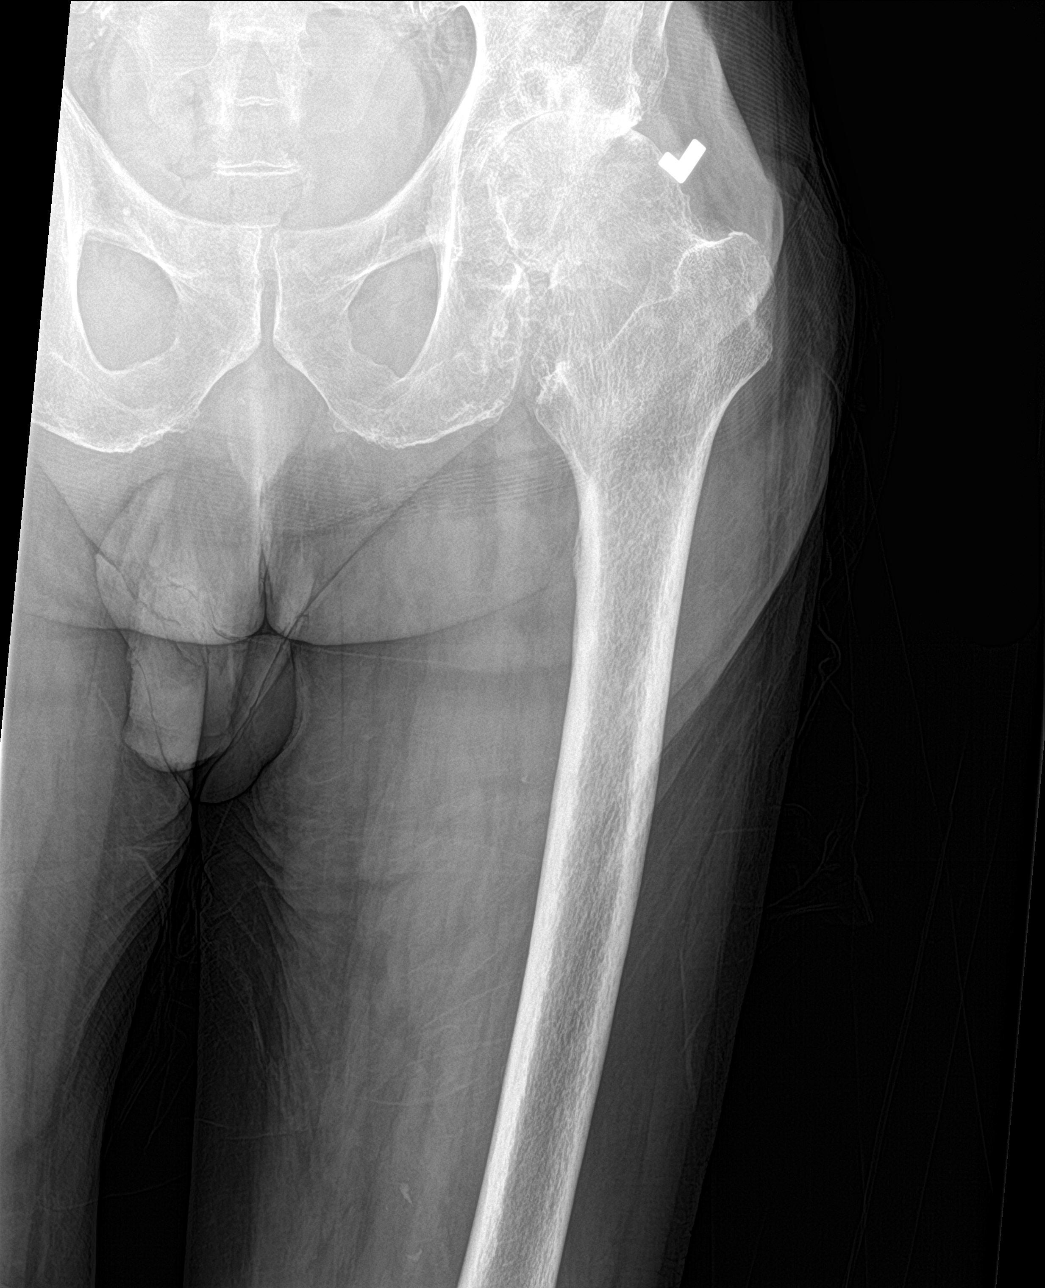

[femur ap (2 of 2)]
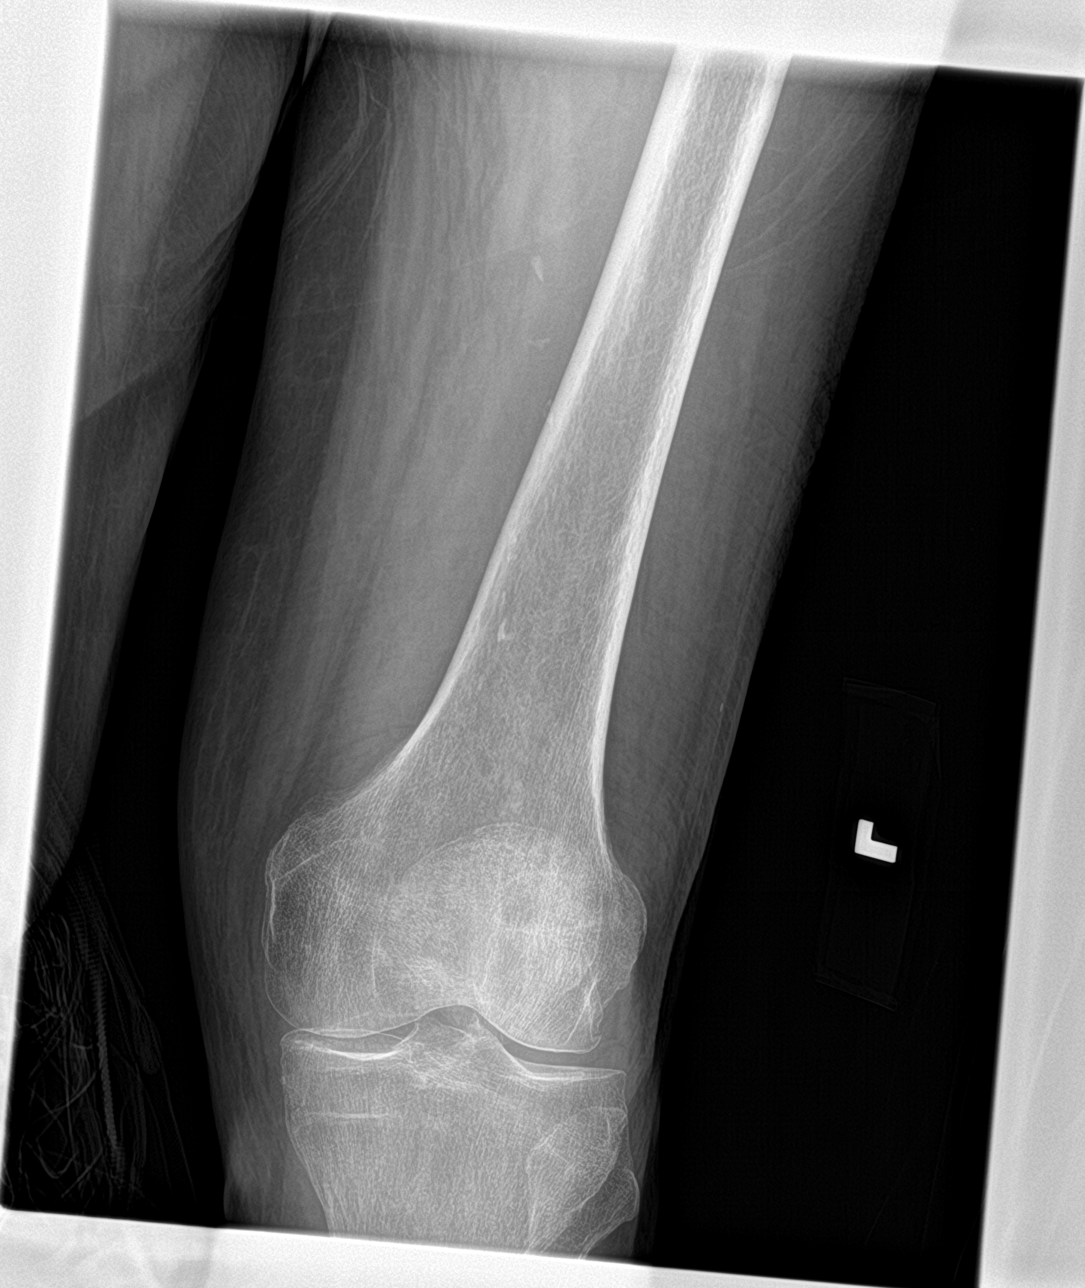

[femur lat (1 of 2)]
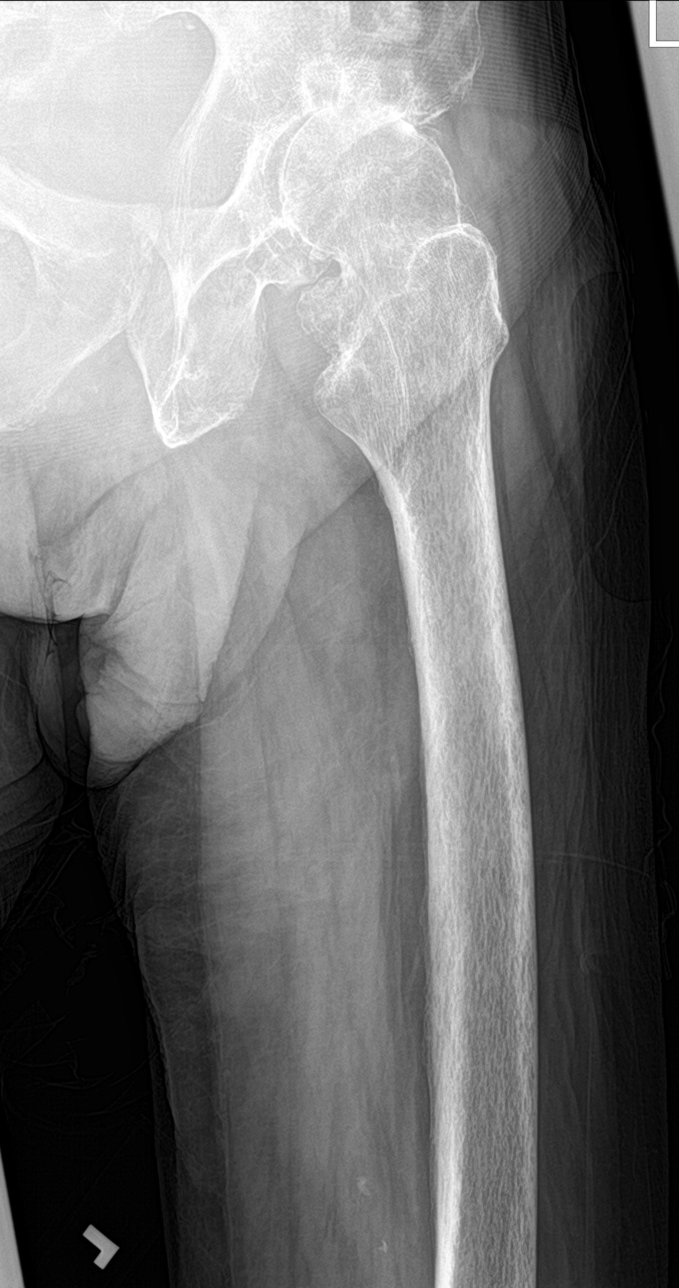

[femur lat (2 of 2)]
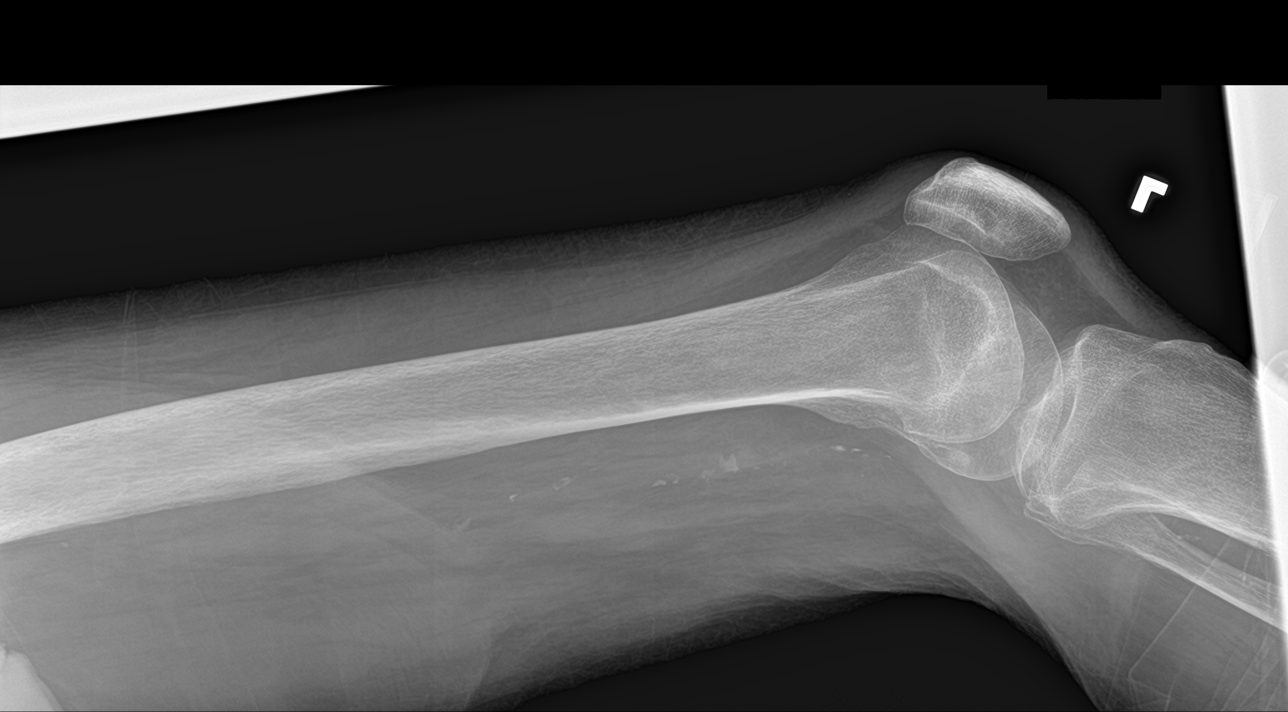

[4 of 4 positions shown; findings below may reference images not displayed]

FINDINGS: No acute fracture or dislocation. There is advanced osteopenia.
There is severe arthritic changes of the left hip with loss of joint
space and bone-on-bone contact. Moderate arthritic changes of the
right hip. The soft tissues are unremarkable.
IMPRESSION: 1. No acute fracture or dislocation.
2. Severe arthritic changes of the left hip.

## 2021-02-09 ENCOUNTER — Non-Acute Institutional Stay: Payer: Medicare Other | Admitting: Nurse Practitioner

## 2021-02-09 ENCOUNTER — Other Ambulatory Visit: Payer: Self-pay

## 2021-02-09 DIAGNOSIS — M25552 Pain in left hip: Secondary | ICD-10-CM

## 2021-02-09 DIAGNOSIS — Z515 Encounter for palliative care: Secondary | ICD-10-CM

## 2021-02-09 NOTE — Progress Notes (Signed)
[facility] Consult Note Telephone: [phone]  Fax: [phone]  PATIENT NAME: Antonio [name] [address] [phone] (home)  DOB: 11/22/1940 MRN: [id number]  PRIMARY CARE PROVIDER:    Earmon Phoenix, NP,  [place] [address] [phone]  REFERRING PROVIDER:   Earmon Phoenix, [address] [place],  [name] [address] [phone]  RESPONSIBLE PARTY:   Extended Emergency Contact Information Primary Emergency Contact: BRADFORD, CAZIER Mobile Phone: [phone] Relation: Daughter Secondary Emergency Contact: Stephania Fragmin [place] Phone: [phone] Mobile Phone: [phone] Relation: Significant other  I met face to face with patient in facility. Son Antonio [name] present at visit.   ASSESSMENT AND RECOMMENDATIONS:   Advance Care Planning: Goal of care: Goal of care is function. Directives: Signed DNR and MOST form present on chart in the facility. Copy uploaded to [facility] EMR. Details of MOST form include limited additional intervention, antibiotics if indicated, IV fluids if indicated, no feeding tube. Patient confused, he however verbalized desire to not be resuscitated in the event of cardiac or respiratory arrest.  Symptom Management:  Dementia: ongoing progressive decline in function and cognition. Staff report patient refuses assistance with his ADLs, chooses to complete task independently. No report of fever, chills or SOB. No evidence of acute distress noted.  Continue support care. Maintain safety, prevent falls.  Left hip pain: Condition is chronic and ongoing. Consider scheduling Tylenol 1083m by mouth twice a day as patient may be unable to request for pain medication when needed. Monitor for evidence of reduced function and non-verbal sign of uncontrolled pain. Palliative care will continue to provided support to  patient, family and medical team. Left ankle edema: FROM noted to left ankle joint, patient denied pain at site. Monitor for worsening symptom, consider imaging to rule out acute condition.  Follow up Palliative Care Visit: Palliative care will continue to follow for complex decision making and symptom management. Return 6-8 weeks or prn.  Family /Caregiver/Community Supports: [facility]  Cognitive / Functional decline:  Patient awake and alert, very confused. Staff report patient independent with his ADLs, report that he does not like taking showers or bath. He is however continent of bowel and bladder.   I spent 50 minutes providing this consultation. More than 50% of the time in this consultation was spent counseling and coordinating communication.   History obtained from review of [facility] Epic EMR,  discussion with facility staff, and interview with patient. Records reviewed and summarized bellow.  HISTORY OF PRESENT ILLNESS:  Antonio AMBORNis a 80y.o. year old male with multiple medical problems including Dementia (6b),  Chronic left Hip pain, HTN. Patient with chronic pain, relieved by PRN Tylenol, patient unable to quantify pain today, very confused with word salad. Palliative Care was asked to follow this patient by consultation request of YEarmon Phoenix NP to help address advance care planning and symptoms management. This is an initial visit.  CODE STATUS: DNR  PPS: 50%  HOSPICE ELIGIBILITY/DIAGNOSIS: TBD  PHYSICAL EXAM / ROS:   General: NAD, frail appearing, thin Cardiovascular: no chest pain reported, +1 edema to left ankle area  Pulmonary: no cough, no increased SOB, room air Abdomen: appetite fair, no report of constipation, continent of bowel GU: no report of dysuria, continent of urine MSK: limited ROM to right hip, ambulatory Skin:  no rashes or wounds reported Neurological: Weakness, but otherwise nonfocal    PAST MEDICAL HISTORY:  Past Medical History:  Diagnosis Date  . Diverticulitis    on colonoscopy in 2005 w Dr Wynetta Emery  . Dyslipidemia   . Eczema    BCC and AK followed by dermatologist Dr Ronnald Ramp Annually  . ED (erectile dysfunction)   . Hip fracture, left ([place])   . Hypertension   . OSA (obstructive sleep apnea)    primarily upper airway resistance syndrome, on CPAP at 11 cm H2O (PSG AHI 3.37/hr and RDI 46.5/hr) followed by Dr Radford Pax  . Osteoporosis     SOCIAL HX:  Social History   Tobacco Use  . Smoking status: Former Smoker    Quit date: 11/05/1994    Years since quitting: 26.2  . Smokeless tobacco: Never Used  Substance Use Topics  . Alcohol use: Yes    Comment: 1-2 daily beer and wine   FAMILY HX:  Family History  Problem Relation Age of Onset  . Breast cancer Mother   . Dementia Father   . Rectal cancer Sister   . Ovarian cancer Sister     ALLERGIES:  Allergies  Allergen Reactions  . Alendronate     Causes pain all over  . Amoxicillin     Causes pain  . Hydrocodone Other (See Comments)    Causes pain     PERTINENT MEDICATIONS:  Outpatient Encounter Medications as of 02/09/2021  Medication Sig  . aspirin 81 MG tablet Take 81 mg by mouth daily.  . cefdinir (OMNICEF) 300 MG capsule Take 1 capsule (300 mg total) by mouth 2 (two) times daily.  . enalapril (VASOTEC) 10 MG tablet Take 10 mg by mouth daily.  . meloxicam (MOBIC) 15 MG tablet Take 15 mg by mouth daily.  . Multiple Vitamin (MULTIVITAMIN) tablet Take 1 tablet by mouth daily.  . sildenafil (VIAGRA) 100 MG tablet Take 100 mg by mouth daily as needed for erectile dysfunction.  . simvastatin (ZOCOR) 40 MG tablet Take 40 mg by mouth daily.  Marland Kitchen triamcinolone cream (KENALOG) 0.1 % Apply 1 application topically as needed.  Marland Kitchen UNABLE TO FIND CPAP   No facility-administered encounter medications on file as of 02/09/2021.    Thank you for the opportunity to participate in the care of Antonio [name]. The palliative  care team will continue to follow. Please call our office at [phone] if we can be of additional assistance.  Jari Favre, DNP, AGPCNP-BC

## 2021-02-16 ENCOUNTER — Non-Acute Institutional Stay: Payer: Medicare Other | Admitting: Nurse Practitioner

## 2021-02-16 ENCOUNTER — Other Ambulatory Visit: Payer: Self-pay

## 2021-02-16 NOTE — Progress Notes (Deleted)
Non face to face

## 2021-03-23 ENCOUNTER — Other Ambulatory Visit: Payer: Self-pay

## 2021-03-23 ENCOUNTER — Non-Acute Institutional Stay: Payer: Medicare Other | Admitting: Nurse Practitioner

## 2021-03-23 NOTE — Progress Notes (Deleted)
  [facility] Consult Note Telephone: [phone]  Fax: [phone]    Date of encounter: 03/23/21 PATIENT NAME: Antonio [name] [address] [place] [address]   [phone] (home)  DOB: 08/25/1941 MRN: [id number]  PRIMARY CARE PROVIDER:    Yarber, Antonio [name],  [address] [place] [address] [phone]  REFERRING PROVIDER:   Yarber, [name] [address] [place],  [place] [address] [phone]  RESPONSIBLE PARTY:    Contact Information    Name Relation Home Work Mobile   Antonio [name] Daughter   [phone]   Antonio [name] Significant other [phone]  [phone]     I met face to face with patient in facility. Palliative Care was asked to follow this patient by consultation request of  Yarber, Antonio [name] to address advance care planning and complex medical decision making. This is a follow up visit.                                   ASSESSMENT AND PLAN / RECOMMENDATIONS:   Advance Care Planning/Goals of Care: Goals include to maximize quality of life and symptom management. Our advance care planning conversation included a discussion about:     The value and importance of advance care planning   Experiences with loved ones who have been seriously ill or have died   Exploration of personal, cultural or spiritual beliefs that might influence medical decisions   Exploration of goals of care in the event of a sudden injury or illness   Identification and preparation of a healthcare agent   Review and updating or creation of an  advance directive document .  Decision not to resuscitate or to de-escalate disease focused treatments due to poor prognosis.  CODE STATUS: DNR  Symptom Management/Plan:    Follow up Palliative Care Visit: Palliative care will continue to follow for complex medical decision making, advance care planning, and clarification of goals. Return *** weeks or prn.  I spent ***  minutes providing this consultation. More than 50% of the time in this consultation was spent in counseling and care coordination.  This visit was coded based on medical decision making (MDM).***  PPS: ***0%  HOSPICE ELIGIBILITY/DIAGNOSIS: TBD  CHIEF COMPLAIN: ***  History obtained from review of EMR, discussion with primary team, and interview with family, facility staff/caregiver and/or Antonio [name].   HISTORY OF PRESENT ILLNESS:  Antonio [name] is a 79 y.o. year old male  with *** .   I reviewed available labs, medications, imaging, studies and related documents from the EMR.  Records reviewed and summarized above.   Physical Exam: General: NAD, frail appearing, thin Cardiovascular: no chest pain reported, no edema to left ankle area  Pulmonary: no cough, no increased SOB, room air Abdomen: appetite fair, no report of constipation, continent of bowel GU: no report of dysuria, continent of urine MSK: limited ROM to left hip, ambulatory Skin: no rashes or wounds reported Neurological: Weakness, but otherwise nonfocal  Past Medical History:  Diagnosis Date  . Diverticulitis    on colonoscopy in 2005 w Dr Johnson  . Dyslipidemia   . Eczema    BCC and AK followed by dermatologist Dr Jones Annually  . ED (erectile dysfunction)   . Hip fracture, left ([facility])   . Hypertension   . OSA (obstructive sleep apnea)    primarily upper airway resistance syndrome, on CPAP at 11 cm   H2O (PSG AHI 3.37/hr and RDI 46.5/hr) followed by Dr Turner  . Osteoporosis     Thank you for the opportunity to participate in the care of Antonio [name].  The palliative care team will continue to follow. Please call our office at [phone] if we can be of additional assistance.    C , NP DNP,AGPCNP-BC  COVID-19 PATIENT SCREENING TOOL Asked and negative response unless otherwise noted:   Have you had symptoms of covid, tested positive or been in contact with someone with symptoms/positive test in  the past 5-10 days?   

## 2021-03-23 NOTE — Progress Notes (Deleted)
[facility] Palliative Care Consult Note Telephone: [phone]  Fax: [phone]    Date of encounter: 03/23/21 PATIENT NAME: Antonio [name] [address]   [phone] (home)  DOB: 07-17-1941 MRN: [id number]  PRIMARY CARE PROVIDER:    Earmon Phoenix, NP,  [place] [address] [phone]  REFERRING PROVIDER:   Earmon Phoenix, NP [address] [place],  [place] [address] [phone]  RESPONSIBLE PARTY:    Contact Information    Name Relation Home Work [place] Daughter   [place] Significant other [phone]  [phone]       I met face to face with patient and family in *** home/facility. Palliative Care was asked to follow this patient by consultation request of  Antonio Phoenix, NP to address advance care planning and complex medical decision making. This is a follow up visit.                                   ASSESSMENT AND PLAN / RECOMMENDATIONS:   Advance Care Planning/Goals of Care: Goals include to maximize quality of life and symptom management. Our advance care planning conversation included a discussion about:     The value and importance of advance care planning   Experiences with loved ones who have been seriously ill or have died   Exploration of personal, cultural or spiritual beliefs that might influence medical decisions   Exploration of goals of care in the event of a sudden injury or illness   Identification and preparation of a healthcare agent   Review and updating or creation of an  advance directive document .  Decision not to resuscitate or to de-escalate disease focused treatments due to poor prognosis.  CODE STATUS:  Symptom Management/Plan: Weight: 142.8lbs   Follow up Palliative Care Visit: Palliative care will continue to follow for complex medical decision making, advance care planning, and clarification of goals. Return  *** weeks or prn.  I spent *** minutes providing this consultation. More than 50% of the time in this consultation was spent in counseling and care coordination.  This visit was coded based on medical decision making (MDM).***  PPS: ***0%  HOSPICE ELIGIBILITY/DIAGNOSIS: TBD  CHIEF COMPLAIN: ***  History obtained from review of EMR, discussion with primary team, and interview with family, facility staff/caregiver and/or Antonio [name].   HISTORY OF PRESENT ILLNESS:  Antonio [name] is a 80 y.o. year old male  with *** .   I reviewed available labs, medications, imaging, studies and related documents from the EMR.  Records reviewed and summarized above.   ROS  *** General: NAD EYES: denies vision changes ENMT: denies dysphagia Cardiovascular: denies chest pain, denies DOE Pulmonary: denies cough, denies increased SOB Abdomen: endorses good appetite, denies constipation, endorses continence of bowel GU: denies dysuria, endorses continence of urine MSK:  denies weakness,  no falls reported Skin: denies rashes or wounds Neurological: denies pain, denies insomnia Psych: Endorses positive mood Heme/lymph/immuno: denies bruises, abnormal bleeding  Physical Exam: Current and past weights: Constitutional: NAD General: frail appearing, thin/WNWD/obese  EYES: anicteric sclera, lids intact, no discharge  ENMT: intact hearing, oral mucous membranes moist, dentition intact CV: S1S2, RRR, no LE edema Pulmonary: LCTA, no increased work of breathing, no cough, room air Abdomen: intake 100%, normo-active BS + 4 quadrants, soft and non tender, no ascites GU: deferred MSK:  no sarcopenia, moves all extremities, ambulatory Skin: warm and dry, no rashes or wounds on visible skin Neuro:  no generalized weakness,  no cognitive impairment Psych: non-anxious affect, A and O x 3 Hem/lymph/immuno: no widespread bruising   Thank you for the opportunity to participate in the care of Antonio [name].  The  palliative care team will continue to follow. Please call our office at [phone] if we can be of additional assistance.   Antonio Destine, NP DNP,AGPCNP-BC  COVID-19 PATIENT SCREENING TOOL Asked and negative response unless otherwise noted:   Have you had symptoms of covid, tested positive or been in contact with someone with symptoms/positive test in the past 5-10 days?

## 2021-03-30 ENCOUNTER — Non-Acute Institutional Stay: Payer: Medicare Other | Admitting: Nurse Practitioner

## 2021-03-30 ENCOUNTER — Other Ambulatory Visit: Payer: Self-pay

## 2021-03-30 DIAGNOSIS — F0280 Dementia in other diseases classified elsewhere without behavioral disturbance: Secondary | ICD-10-CM

## 2021-03-30 DIAGNOSIS — R52 Pain, unspecified: Secondary | ICD-10-CM

## 2021-03-30 DIAGNOSIS — Z515 Encounter for palliative care: Secondary | ICD-10-CM

## 2021-03-30 NOTE — Progress Notes (Signed)
[facility] Pall[facility] Consult Note Telephone: [phone]  Fax: [phone]    Date of encounter: 03/30/21 PATIENT NAME: [name] [address]   [phone] (home)  DOB: 03/25/1941 MRN: [id number]  PRIMARY CARE PROVIDER:    Earmon Phoenix, NP,  [place] [address] [phone]  REFERRING PROVIDER:   Earmon Phoenix, NP [address] [place],  [place] [address] [phone]  RESPONSIBLE PARTY:    Contact Information    Name Relation Home Work [place] Daughter   [phone]   Antonio [name] Significant other [phone]  [phone]     I met face to face with patient in facility. Palliative Care was asked to follow this patient by consultation request of  Antonio Phoenix, NP to address advance care planning and complex medical decision making. This is a follow up visit.                                  ASSESSMENT AND PLAN / RECOMMENDATIONS:   Advance Care Planning/Goals of Care:  CODE STATUS: DNR Goal of care: Patient's goal of care is function. Directives: Signed DNR and MOST form present on chart in the facility. Copy uploaded to [facility] EMR. Details of MOST form include limited additional intervention, antibiotics if indicated, IV fluids if indicated, no feeding tube.   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 Continue supportive care. Consider transitioning patient to the memory care unit to ensure more support with ADLs and assistance with routines.  Left hip pain: Pain currently controlled on current regimen. Continue Tylenol 500g twice a day and Ibuprofen as needed. Provided general support and encouragement, no other unmet needs identified at this time.  Visit update discussed with patient's daughter Antonio [name]. Discussed recommendation for memorty care unit placement due to  patient's difficulty completing ADLs independently and often missing his way after going to the dinning area for meals. Antonio [name] verbalized awareness of patient's struggles but report she believed patient would decline more if placed in a locked unit. She expressed her disagreement with moving patient to a locked unit saying changing his room will make him more confused and put him out of his routine. Validation provided. Questions and concerns were addressed. Antonio [name] was encouraged to call with questions and/or concerns.   Follow up Palliative Care Visit: Palliative care will continue to follow for complex medical decision making, advance care planning, and clarification of goals. Return in about 4-8 weeks or prn.  I spent 48 minutes providing this consultation. More than 50% of the time in this consultation was spent in counseling and care coordination.  PPS: 50%  HOSPICE ELIGIBILITY/DIAGNOSIS: TBD  CHIEF COMPLAIN: follow up on worsening confusion  History obtained from review of Epic EMR, discussion with facility staff, and interview with with patient's daughter Antonio [name].   HISTORY OF PRESENT ILLNESS:  Antonio [name] is a 80 y.o. year old male with multiple medical problems including Dementia (6b), Chronic left Hip pain, HTN.  Patient with worsening confusion in the context of progression of dementia. Facility staff report ongoing worsening decline in both function and cognition. Facility report patient with increased difficulty completing his ADLs, patient requiring prompting to feed self, refuses to bath/shower, forgets how to get to his room. Staff report this decline is negatively affecting in function in the Assisted  Living environment. No report of fever, chills, SOB, or evidence of uncontrolled pain.  I reviewed available labs, medications, imaging, studies and related documents from the EMR. Records reviewed and summarized above.   PHYSICAL EXAM / ROS:  General: NAD, frail appearing,  thin Cardiovascular: no chest pain reported, +1 edema to left ankle area  Pulmonary: no cough, no increased SOB, room air Abdomen: appetite fair, no report of constipation, continent of bowel GU: no report of dysuria, continent of urine MSK: limited ROM to left hip, ambulatory Skin: no rashes or wounds reported Neurological: Weakness, very confused  Past Medical History:  Diagnosis Date  . Diverticulitis    on colonoscopy in 2005 w Dr Wynetta Emery  . Dyslipidemia   . Eczema    [place] and [place] followed by dermatologist Dr Ronnald Ramp Annually  . ED (erectile dysfunction)   . Hip fracture, left ([facility])   . Hypertension   . OSA (obstructive sleep apnea)    primarily upper airway resistance syndrome, on CPAP at 11 cm H2O (PSG AHI 3.37/hr and RDI 46.5/hr) followed by Dr Radford Pax  . Osteoporosis    Thank you for the opportunity to participate in the care of Antonio [name].  The palliative care team will continue to follow. Please call our office at [phone] if we can be of additional assistance.   Jari Favre, DNP, AGPCNP-BC  COVID-19 PATIENT SCREENING TOOL Asked and negative response unless otherwise noted:   Have you had symptoms of covid, tested positive or been in contact with someone with symptoms/positive test in the past 5-10 days?

## 2021-06-13 ENCOUNTER — Other Ambulatory Visit: Payer: Self-pay

## 2021-06-13 ENCOUNTER — Non-Acute Institutional Stay: Payer: Medicare Other | Admitting: *Deleted

## 2021-06-13 ENCOUNTER — Non-Acute Institutional Stay: Payer: Medicare Other

## 2021-06-13 VITALS — BP 107/55 | HR 62 | Temp 97.6°F | Resp 16

## 2021-06-13 DIAGNOSIS — Z515 Encounter for palliative care: Secondary | ICD-10-CM

## 2021-06-13 NOTE — Progress Notes (Signed)
[facility] COMMUNITY PALLIATIVE CARE RN NOTE  PATIENT NAME: Antonio [name] DOB: 1941/01/14 MRN: [id number]  PRIMARY CARE PROVIDER: Earmon Phoenix, NP  RESPONSIBLE PARTY:  Acct ID - Guarantor Home Phone Work Phone Relationship Acct Type  [id number] ZYQUAN, CROTTY [phone]  Self P/F     [place], [place] [address]   Covid-19 Pre-screening Negative  PLAN OF CARE and INTERVENTION:  ADVANCE CARE PLANNING/GOALS OF CARE: Goal is for patient to move to the Memory Care unit within [facility] if family agrees. He has a DNR. PATIENT/CAREGIVER EDUCATION: Symptom management, safe mobility, dementia education DISEASE STATUS: Joint palliative care visit completed with LCSW, M. Lonon. Met with patient in his apartment and was accompanied by 2 facility staff members. Staff reports that patient's confusion continues to escalate. He is able to answer some very simple questions regarding how he feels, but most answers are scattered and inappropriate to what is being asked. Staff reports that patient speaks of people being in his room playing games with him along with English people chasing him. He sleeps on the couch, as he has his bed covered with personal items. He does not think that the bedroom is his. He often wanders around the unit. He is currently in Assisted living so this unit is not a locked unit. During mealtimes when staff takes him to the dining room, he will often leave the dining room and go back to his room. Staff will then have to bring him back to the dining room and encourage him to eat. He is refusing personal care most of the time. They were able to get him to agree to his shower last week. Today is shower day and when telling patient it is time for his shower he replied, "I don't need a shower." He did eventual agree to allow staff to shave him. He did allow me to perform my RN assessment. Facility is working with family to get patient moved to the Memory care unit. He is  ambulatory using a walker and requires 1 person assistance with ADLs. He did not have any complaints and denied pain. He is intermittently incontinent of both bowel and bladder and wears Depends. Will continue to monitor.  HISTORY OF PRESENT ILLNESS: This is a 80 yo male with a diagnosis of dementia. He has a past history of essential hypertension, obstructive sleep apnea and gait disturbance. Palliative care team continues to follow patient for additional support, goals of care and complex decision making.  CODE STATUS: DNR ADVANCED DIRECTIVES: N MOST FORM: no PPS: 50%   PHYSICAL EXAM:   VITALS: Today's Vitals   06/13/21 1006  BP: (!) 107/55  Pulse: 62  Resp: 16  Temp: 97.6 F (36.4 C)  TempSrc: Temporal  SpO2: 100%  PainSc: 0-No pain    LUNGS: clear to auscultation  CARDIAC: Cor RRR EXTREMITIES: No edema SKIN:  Exposed skin is dry and intact   NEURO:  Alert and oriented to self, continuously confused, generalized weakness, ambulatory w/walker   (Duration of visit and documentation 45 minutes)   Daryl Eastern, RN BSN

## 2021-06-13 NOTE — Progress Notes (Signed)
COMMUNITY PALLIATIVE CARE SW NOTE  PATIENT NAME: Antonio [name] DOB: 28-May-1941 MRN: [id number]  PRIMARY CARE PROVIDER: Earmon Phoenix, NP  RESPONSIBLE PARTY:  Acct ID - Guarantor Home Phone Work Phone Relationship Acct Type  [id number] Antonio, [name] [phone]  Self P/F     [place], [place] [address]     PLAN OF CARE and INTERVENTIONS:             GOALS OF CARE/ ADVANCE CARE PLANNING:  Goal is for patient to move to memory care. Patient is a DNR.  SOCIAL/EMOTIONAL/SPIRITUAL ASSESSMENT/ INTERVENTIONS:  SW and RN-Antonio [name] completed a visit wit patient at the facility, [facility]. The palliative care team was joined by the facility staff-Antonio [name] and [facility]. The patient was found on his couch sleeping. He easily aroused to verbal prompts. Patient was alert and oriented to self. He appeared to be confused as his verbalizations were disorganized, scattered, and irrelevant. Patient denied pain. He was offered assistance with a shower or shave and he initially declined. However staff was able to get him up and agree to a shave. Staff report that patient is increasingly confused, wandering, not eating and refuses to sleep in his bed.  The staff is currently working with the family to transfer patient to the memory care unit. SW provided supportive presence, observation, assessed needs and comfort of patient and consulted with staff. The facility staff remains open to collaborate with the palliative care team regarding patient's care.  PATIENT/CAREGIVER EDUCATION/ COPING: Patient is pleasantly confused. PERSONAL EMERGENCY PLAN:  Per facility protocol.  COMMUNITY RESOURCES COORDINATION/ HEALTH CARE NAVIGATION:  None. FINANCIAL/LEGAL CONCERNS/INTERVENTIONS:  None.      SOCIAL HX:  Social History   Tobacco Use   Smoking status: Former    Types: Cigarettes    Quit date: 11/05/1994    Years since quitting: 26.6   Smokeless tobacco: Never  Substance Use Topics   Alcohol use: Yes    Comment:  1-2 daily beer and wine    CODE STATUS: DNR ADVANCED DIRECTIVES: Yes MOST FORM COMPLETE:  No HOSPICE EDUCATION PROVIDED: No  PPS: Patient is having increased confusion where he is wandering, refusing personal care or to sleep in his bed.   Duration of visit and documentation: 45 minutes  Antonio Puller, LCSW

## 2021-08-30 ENCOUNTER — Emergency Department (HOSPITAL_COMMUNITY)
Admission: EM | Admit: 2021-08-30 | Discharge: 2021-08-31 | Disposition: A | Discharge status: Home / Self Care | Payer: Medicare Other | Attending: Emergency Medicine | Admitting: Emergency Medicine

## 2021-08-30 ENCOUNTER — Encounter (HOSPITAL_COMMUNITY): Payer: Self-pay

## 2021-08-30 ENCOUNTER — Emergency Department (HOSPITAL_COMMUNITY): Payer: Medicare Other

## 2021-08-30 DIAGNOSIS — Z79899 Other long term (current) drug therapy: Secondary | ICD-10-CM | POA: Insufficient documentation

## 2021-08-30 DIAGNOSIS — R310 Gross hematuria: Secondary | ICD-10-CM

## 2021-08-30 DIAGNOSIS — N329 Bladder disorder, unspecified: Secondary | ICD-10-CM | POA: Insufficient documentation

## 2021-08-30 DIAGNOSIS — I10 Essential (primary) hypertension: Secondary | ICD-10-CM | POA: Insufficient documentation

## 2021-08-30 DIAGNOSIS — N3289 Other specified disorders of bladder: Secondary | ICD-10-CM

## 2021-08-30 DIAGNOSIS — Z87891 Personal history of nicotine dependence: Secondary | ICD-10-CM | POA: Insufficient documentation

## 2021-08-30 DIAGNOSIS — R319 Hematuria, unspecified: Secondary | ICD-10-CM | POA: Diagnosis present

## 2021-08-30 LAB — CBC WITH DIFFERENTIAL/PLATELET
Abs Immature Granulocytes: 0.02 10*3/uL (ref 0.00–0.07)
Basophils Absolute: 0 10*3/uL (ref 0.0–0.1)
Basophils Relative: 1 %
Eosinophils Absolute: 0.1 10*3/uL (ref 0.0–0.5)
Eosinophils Relative: 1 %
HCT: 37.4 % — ABNORMAL LOW (ref 39.0–52.0)
Hemoglobin: 12.3 g/dL — ABNORMAL LOW (ref 13.0–17.0)
Immature Granulocytes: 0 %
Lymphocytes Relative: 18 %
Lymphs Abs: 1.5 10*3/uL (ref 0.7–4.0)
MCH: 33.1 pg (ref 26.0–34.0)
MCHC: 32.9 g/dL (ref 30.0–36.0)
MCV: 100.5 fL — ABNORMAL HIGH (ref 80.0–100.0)
Monocytes Absolute: 1.1 10*3/uL — ABNORMAL HIGH (ref 0.1–1.0)
Monocytes Relative: 14 %
Neutro Abs: 5.2 10*3/uL (ref 1.7–7.7)
Neutrophils Relative %: 66 %
Platelets: 352 10*3/uL (ref 150–400)
RBC: 3.72 MIL/uL — ABNORMAL LOW (ref 4.22–5.81)
RDW: 13.5 % (ref 11.5–15.5)
WBC: 7.9 10*3/uL (ref 4.0–10.5)
nRBC: 0 % (ref 0.0–0.2)

## 2021-08-30 LAB — BASIC METABOLIC PANEL
Anion gap: 4 — ABNORMAL LOW (ref 5–15)
BUN: 25 mg/dL — ABNORMAL HIGH (ref 8–23)
CO2: 29 mmol/L (ref 22–32)
Calcium: 8.6 mg/dL — ABNORMAL LOW (ref 8.9–10.3)
Chloride: 106 mmol/L (ref 98–111)
Creatinine, Ser: 0.68 mg/dL (ref 0.61–1.24)
GFR, Estimated: >60 mL/min (ref >60)
Glucose, Bld: 95 mg/dL (ref 70–99)
Potassium: 4.3 mmol/L (ref 3.5–5.1)
Sodium: 139 mmol/L (ref 135–145)

## 2021-08-30 LAB — URINALYSIS, ROUTINE W REFLEX MICROSCOPIC
Bilirubin Urine: NEGATIVE
Glucose, UA: NEGATIVE mg/dL
Ketones, ur: 5 mg/dL — AB
Leukocytes,Ua: NEGATIVE
Nitrite: NEGATIVE
Protein, ur: 100 mg/dL — AB
RBC / HPF: >50 RBC/hpf — ABNORMAL HIGH (ref 0–5)
Specific Gravity, Urine: 1.023 (ref 1.005–1.030)
pH: 6 (ref 5.0–8.0)

## 2021-08-30 MED ORDER — CEFTRIAXONE SODIUM 1 G IJ SOLR
1.0000 g | Freq: Once | INTRAMUSCULAR | Status: AC
  Administered 2021-08-30: 20:00:00 1 g via INTRAMUSCULAR
  Filled 2021-08-30: qty 10

## 2021-08-30 MED ORDER — LORAZEPAM 2 MG/ML IJ SOLN
1.0000 mg | Freq: Once | INTRAMUSCULAR | Status: AC
  Administered 2021-08-30: 20:00:00 1 mg via INTRAMUSCULAR
  Filled 2021-08-30: qty 1

## 2021-08-30 MED ORDER — LORAZEPAM 0.5 MG PO TABS: 0.5000 mg | ORAL_TABLET | Freq: Once | ORAL | Status: DC

## 2021-08-30 MED ORDER — STERILE WATER FOR INJECTION IJ SOLN
INTRAMUSCULAR | Status: AC
  Administered 2021-08-30: 20:00:00 2.1 mL
  Filled 2021-08-30: qty 10

## 2021-08-30 MED ORDER — CEPHALEXIN 500 MG PO CAPS: 500.0000 mg | ORAL_CAPSULE | Freq: Four times a day (QID) | ORAL | 0 refills | Status: AC

## 2021-08-30 NOTE — ED Triage Notes (Signed)
Pt presents to the ED via EMS from SNF for hematuria which, per EMS, staff stated began this morning. Per EMS, pt has no accompanying sx. Pt has dementia at a baseline and is oriented to person alone.

## 2021-08-30 NOTE — ED Provider Notes (Signed)
[facility] DEPT Provider Note   CSN: [id number] Arrival date & time: 08/30/21  1425     History Chief Complaint  Patient presents with   Hematuria    Antonio [name] is a 80 y.o. male.  Pt presents to the ED today with hematuria.  Pt has dementia and is at a SNF.  Staff noticed some blood in his urine today. No fevers.  Pt has no complaints and was  unaware of the hematuria.      Past Medical History:  Diagnosis Date   Diverticulitis    on colonoscopy in 2005 w Dr Wynetta Emery   Dyslipidemia    Eczema    [place] and AK followed by dermatologist Dr Ronnald Ramp Annually   ED (erectile dysfunction)    Hip fracture, left ([facility])    Hypertension    OSA (obstructive sleep apnea)    primarily upper airway resistance syndrome, on CPAP at 11 cm H2O (PSG AHI 3.37/hr and RDI 46.5/hr) followed by Dr Radford Pax   Osteoporosis     Patient Active Problem List   Diagnosis Date Noted   Obstructive sleep apnea 01/14/2014   Essential hypertension, benign 01/14/2014   HIP PAIN, LEFT 06/08/2008   GAIT DISTURBANCE 06/08/2008    Past Surgical History:  Procedure Laterality Date   COLONOSCOPY  2005   TONSILLECTOMY     as child       Family History  Problem Relation Age of Onset   Breast cancer Mother    Dementia Father    Rectal cancer Sister    Ovarian cancer Sister     Social History   Tobacco Use   Smoking status: Former    Types: Cigarettes    Quit date: 11/05/1994    Years since quitting: 26.8   Smokeless tobacco: Never  Substance Use Topics   Alcohol use: Yes    Comment: 1-2 daily beer and wine   Drug use: No    Home Medications Prior to Admission medications   Medication Sig Start Date End Date Taking? Authorizing Provider  cephALEXin (KEFLEX) 500 MG capsule Take 1 capsule (500 mg total) by mouth 4 (four) times daily. 08/30/21  Yes Isla Pence, MD  cefdinir (OMNICEF) 300 MG capsule Take 1 capsule (300 mg total) by mouth 2 (two) times daily.  9/92/42   Delora Fuel, MD  enalapril (VASOTEC) 10 MG tablet Take 10 mg by mouth daily.    [provider]  Multiple Vitamin (MULTIVITAMIN) tablet Take 1 tablet by mouth daily.    [provider]  sildenafil (VIAGRA) 100 MG tablet Take 100 mg by mouth daily as needed for erectile dysfunction.    [provider]  simvastatin (ZOCOR) 40 MG tablet Take 40 mg by mouth daily.    [provider]  triamcinolone cream (KENALOG) 0.1 % Apply 1 application topically as needed.    [provider]  UNABLE TO FIND CPAP    [provider]    Allergies    Alendronate, Amoxicillin, and Hydrocodone  Review of Systems   Review of Systems  Genitourinary:  Positive for hematuria.  All other systems reviewed and are negative.  Physical Exam Updated Vital Signs BP 121/73   Pulse 78   Temp 98.6 F (37 C) (Oral)   Resp 18   SpO2 100%   Physical Exam Vitals and nursing note reviewed. Exam conducted with a chaperone present.  Constitutional:      Appearance: Normal appearance.  HENT:  Head: Normocephalic and atraumatic.     Right Ear: External ear normal.     Left Ear: External ear normal.     Nose: Nose normal.     Mouth/Throat:     Mouth: Mucous membranes are moist.     Pharynx: Oropharynx is clear.  Eyes:     Extraocular Movements: Extraocular movements intact.     Conjunctiva/sclera: Conjunctivae normal.     Pupils: Pupils are equal, round, and reactive to light.  Cardiovascular:     Rate and Rhythm: Normal rate and regular rhythm.     Pulses: Normal pulses.     Heart sounds: Normal heart sounds.  Pulmonary:     Effort: Pulmonary effort is normal.     Breath sounds: Normal breath sounds.  Abdominal:     General: Abdomen is flat. Bowel sounds are normal.     Palpations: Abdomen is soft.  Genitourinary:    Penis: Normal and circumcised.   Musculoskeletal:        General: Normal range of motion.     Cervical back: Normal range of  motion and neck supple.  Skin:    General: Skin is warm.     Capillary Refill: Capillary refill takes less than 2 seconds.  Neurological:     General: No focal deficit present.     Mental Status: He is alert. Mental status is at baseline. He is disoriented.  Psychiatric:        Mood and Affect: Mood normal.        Behavior: Behavior normal.    ED Results / Procedures / Treatments   Labs (all labs ordered are listed, but only abnormal results are displayed) Labs Reviewed  BASIC METABOLIC PANEL - Abnormal; Notable for the following components:      Result Value   BUN 25 (*)    Calcium 8.6 (*)    Anion gap 4 (*)    All other components within normal limits  CBC WITH DIFFERENTIAL/PLATELET - Abnormal; Notable for the following components:   RBC 3.72 (*)    Hemoglobin 12.3 (*)    HCT 37.4 (*)    MCV 100.5 (*)    Monocytes Absolute 1.1 (*)    All other components within normal limits  URINALYSIS, ROUTINE W REFLEX MICROSCOPIC - Abnormal; Notable for the following components:   Color, Urine AMBER (*)    APPearance CLOUDY (*)    Hgb urine dipstick LARGE (*)    Ketones, ur 5 (*)    Protein, ur 100 (*)    RBC / HPF >50 (*)    Bacteria, UA RARE (*)    All other components within normal limits  URINE CULTURE    EKG None  Radiology CT Renal Stone Study  Result Date: 08/30/2021 CLINICAL DATA:  Hematuria. EXAM: CT ABDOMEN AND PELVIS WITHOUT CONTRAST TECHNIQUE: Multidetector CT imaging of the abdomen and pelvis was performed following the standard protocol without IV contrast. COMPARISON:  Pelvis CT 11/30/2020. FINDINGS: Lower chest: Right-sided fat containing Bochdalek hernia. Lung bases are clear. Hepatobiliary: Gallstones are present. There is no bile duct dilatation. Liver is within normal limits. Pancreas: Unremarkable. No pancreatic ductal dilatation or surrounding inflammatory changes. Spleen: Normal in size without focal abnormality. Adrenals/Urinary Tract: There is a 2.2 cm  right renal cyst. There is a 2.0 cm left renal cyst. There is no hydronephrosis or urinary tract calculus. Adrenal glands are within normal limits. Focal nodular density along the posterior right bladder wall is indeterminate measuring 1.5 x  2.5 cm. No bladder calculi are seen. Stomach/Bowel: Stomach is within normal limits. Appendix is not seen. No evidence of bowel wall thickening, distention, or inflammatory changes. There is sigmoid colon diverticulosis without evidence for diverticulitis. Vascular/Lymphatic: Aortic atherosclerosis. No enlarged abdominal or pelvic lymph nodes. Reproductive: Prostate is unremarkable. Other: No abdominal wall hernia or abnormality. No abdominopelvic ascites. Musculoskeletal: Degenerative changes affect the spine. There are moderate degenerative changes of the right hip in severe/end-stage degenerative changes of the left hip. There are healed right-sided rib fractures. IMPRESSION: 1. Nodular density in the posterior right bladder measuring up to 2.5 cm. Malignancy not excluded. Differential diagnosis would include hemorrhage and infection. No urinary tract calculi. 2. Cholelithiasis. 3.  Aortic Atherosclerosis (ICD10-I70.0). Electronically Signed   By: Ronney Asters M.D.   On: 08/30/2021 19:07    Procedures Procedures   Medications Ordered in ED Medications  LORazepam (ATIVAN) injection 1 mg (has no administration in time range)  cefTRIAXone (ROCEPHIN) injection 1 g (has no administration in time range)    ED Course  I have reviewed the triage vital signs and the nursing notes.  Pertinent labs & imaging results that were available during my care of the patient were reviewed by me and considered in my medical decision making (see chart for details).    MDM Rules/Calculators/A&P                           Hematuria likely from the abnormality in his bladder.  Urine without definite evidence for UTI, but will treat in case this is the cause.  Culture pending.  Pt  d/w his daughter, Claiborne Billings.  She said they are not interested in getting treatment for potential bladder cancer.  She is trying to get pt on hospice at his facility.  She is aware this mass is the likely source for the bleeding.  Pt is urinating well at this time.  He is stable for d/c.  Return if worse.  Final Clinical Impression(s) / ED Diagnoses Final diagnoses:  Gross hematuria  Bladder mass    Rx / DC Orders ED Discharge Orders          Ordered    cephALEXin (KEFLEX) 500 MG capsule  4 times daily        08/30/21 1918             Isla Pence, MD 08/30/21 [place]ly [place]

## 2021-08-30 NOTE — Discharge Instructions (Addendum)
Do not give ibuprofen.

## 2021-08-31 LAB — URINE CULTURE: Culture: NO GROWTH

## 2021-12-06 DEATH — deceased
